# Patient Record
Sex: Female | Born: 1990 | Race: Black or African American | Hispanic: No | Smoking: Former smoker
Health system: Southern US, Community
[De-identification: ages and names within clinical notes are randomized; demographics above are authoritative.]

## PROBLEM LIST (undated history)

## (undated) DIAGNOSIS — G43909 Migraine, unspecified, not intractable, without status migrainosus: Secondary | ICD-10-CM

## (undated) HISTORY — PX: ANTERIOR CRUCIATE LIGAMENT REPAIR: SHX115

---

## 2012-08-31 ENCOUNTER — Emergency Department (HOSPITAL_BASED_OUTPATIENT_CLINIC_OR_DEPARTMENT_OTHER)
Admission: EM | Admit: 2012-08-31 | Discharge: 2012-08-31 | Disposition: A | Discharge status: Home / Self Care | Payer: BC Managed Care – PPO | Attending: Emergency Medicine | Admitting: Emergency Medicine

## 2012-08-31 ENCOUNTER — Encounter (HOSPITAL_BASED_OUTPATIENT_CLINIC_OR_DEPARTMENT_OTHER): Payer: Self-pay

## 2012-08-31 DIAGNOSIS — Z711 Person with feared health complaint in whom no diagnosis is made: Secondary | ICD-10-CM | POA: Insufficient documentation

## 2012-08-31 DIAGNOSIS — Z3202 Encounter for pregnancy test, result negative: Secondary | ICD-10-CM | POA: Insufficient documentation

## 2012-08-31 DIAGNOSIS — Z0000 Encounter for general adult medical examination without abnormal findings: Secondary | ICD-10-CM

## 2012-08-31 LAB — URINALYSIS, ROUTINE W REFLEX MICROSCOPIC
Bilirubin Urine: NEGATIVE
Hgb urine dipstick: NEGATIVE
Nitrite: NEGATIVE
Specific Gravity, Urine: 1.028 (ref 1.005–1.030)
Urobilinogen, UA: 1 mg/dL (ref 0.0–1.0)
pH: 5.5 (ref 5.0–8.0)

## 2012-08-31 LAB — PREGNANCY, URINE: Preg Test, Ur: NEGATIVE

## 2012-08-31 NOTE — ED Provider Notes (Signed)
History     CSN: [id number]  Arrival date & time 08/31/12  1201   First MD Initiated Contact with Patient 08/31/12 1236      Chief Complaint  Patient presents with  . Emesis    (Consider location/radiation/quality/duration/timing/severity/associated sxs/prior treatment) HPI Comments: Pt states she vomited once yesterday and was not feeling well but since all has resolved and felt better after the one episode of emesis.  States she attempted to return to work and they states she had to have a md note.  Patient is a 22 y.o. female presenting with vomiting. The history is provided by the patient.  Emesis Severity:  Mild Duration:  12 hours Timing:  Rare Number of daily episodes:  1 Quality:  Stomach contents Progression:  Resolved Chronicity:  New Recent urination:  Normal Relieved by:  None tried Ineffective treatments:  None tried Associated symptoms: no abdominal pain, no arthralgias, no chills, no cough, no diarrhea and no fever     History reviewed. No pertinent past medical history.  History reviewed. No pertinent past surgical history.  No family history on file.  History  Substance Use Topics  . Smoking status: Never Smoker   . Smokeless tobacco: Not on file  . Alcohol Use: Yes    OB History   Grav Para Term Preterm Abortions TAB SAB Ect Mult Living                  Review of Systems  Constitutional: Negative for chills.  Gastrointestinal: Positive for vomiting. Negative for abdominal pain and diarrhea.  Musculoskeletal: Negative for arthralgias.  All other systems reviewed and are negative.    Allergies  Review of patient's allergies indicates no known allergies.  Home Medications  No current outpatient prescriptions on file.  BP 117/71  Pulse 79  Temp(Src) 98.3 F (36.8 C) (Oral)  Resp 20  Ht 5\' 4"  (1.626 m)  Wt 135 lb (61.236 kg)  BMI 23.16 kg/m2  SpO2 100%  LMP 08/08/2012  Physical Exam  Nursing note and vitals  reviewed. Constitutional: She is oriented to person, place, and time. She appears well-developed and well-nourished. No distress.  HENT:  Head: Normocephalic and atraumatic.  Mouth/Throat: Oropharynx is clear and moist.  Eyes: Conjunctivae and EOM are normal. Pupils are equal, round, and reactive to light.  Neck: Normal range of motion. Neck supple.  Cardiovascular: Normal rate, regular rhythm and intact distal pulses.   No murmur heard. Pulmonary/Chest: Effort normal and breath sounds normal. No respiratory distress. She has no wheezes. She has no rales.  Abdominal: Soft. She exhibits no distension. There is no tenderness. There is no rebound and no guarding.  Musculoskeletal: Normal range of motion. She exhibits no edema and no tenderness.  Neurological: She is alert and oriented to person, place, and time.  Skin: Skin is warm and dry. No rash noted. No erythema.  Psychiatric: She has a normal mood and affect. Her behavior is normal.    ED Course  Procedures (including critical care time)  Labs Reviewed  URINALYSIS, ROUTINE W REFLEX MICROSCOPIC - Abnormal; Notable for the following:    APPearance CLOUDY (*)    Ketones, ur 15 (*)    All other components within normal limits  PREGNANCY, URINE   No results found.   1. General medical exam       MDM   Pt here for a MD note to return to work after episode of vomiting yesterday.  States feels fine now and has  a normal exam.  Will d/c home.       Gwyneth Sprout, MD 08/31/12 1501

## 2012-08-31 NOTE — ED Notes (Signed)
Pt reports she vomited x 1 this am-"i feel better now"-states she needs RTW note

## 2014-06-12 ENCOUNTER — Emergency Department (HOSPITAL_COMMUNITY)
Admission: EM | Admit: 2014-06-12 | Discharge: 2014-06-12 | Disposition: A | Discharge status: Home / Self Care | Payer: Self-pay | Source: Home / Self Care

## 2015-10-11 ENCOUNTER — Encounter (HOSPITAL_COMMUNITY): Payer: Self-pay | Admitting: Emergency Medicine

## 2015-10-11 ENCOUNTER — Emergency Department (HOSPITAL_COMMUNITY)
Admission: EM | Admit: 2015-10-11 | Discharge: 2015-10-11 | Disposition: A | Discharge status: Home / Self Care | Payer: Self-pay | Attending: Emergency Medicine | Admitting: Emergency Medicine

## 2015-10-11 DIAGNOSIS — F17210 Nicotine dependence, cigarettes, uncomplicated: Secondary | ICD-10-CM | POA: Insufficient documentation

## 2015-10-11 DIAGNOSIS — K047 Periapical abscess without sinus: Secondary | ICD-10-CM | POA: Insufficient documentation

## 2015-10-11 MED ORDER — CLINDAMYCIN HCL 150 MG PO CAPS: 450.0000 mg | ORAL_CAPSULE | Freq: Three times a day (TID) | ORAL | 0 refills | Status: DC

## 2015-10-11 MED ORDER — NAPROXEN 500 MG PO TABS: 500.0000 mg | ORAL_TABLET | Freq: Two times a day (BID) | ORAL | 0 refills | Status: DC

## 2015-10-11 NOTE — ED Triage Notes (Signed)
Patient states L sided facial swelling after she noticed a hard bump on her L upper gum area.   Patient denies any drainage.   Patient denies other symptoms.

## 2015-10-11 NOTE — ED Provider Notes (Signed)
[facility] Provider Note   CSN: [id number] Arrival date & time: 10/11/15  4098  First Provider Contact:  First MD Initiated Contact with Patient 10/11/15 1103    By signing my name below, I, Freida Busman, attest that this documentation has been prepared under the direction and in the presence of non-physician practitioner, Santiago Glad, PA-C. Electronically Signed: Freida Busman, Scribe. 10/11/2015. 11:04 AM.  History   Chief Complaint Chief Complaint  Patient presents with  . Facial Swelling    The history is provided by the patient. No language interpreter was used.    HPI Comments:  Kathleen Duffy is a 25 y.o. female who presents to the Emergency Department complaining of gradually worsening left sided  facial swelling which she woke up with 2 days ago. She reports associated mild pain to the site.  Pt also notes multiple known dental carries. She denies fever, SOB, throat swelling/difficulty swallowing, ear pain, itching to the site, recent insect bite, fever, nausea and vomiting. No alleviating factors noted.  History reviewed. No pertinent past medical history.  There are no active problems to display for this patient.   History reviewed. No pertinent surgical history.  OB History    No data available      Home Medications    Prior to Admission medications   Not on File    Family History No family history on file.  Social History Social History  Substance Use Topics  . Smoking status: Current Some Day Smoker    Types: Cigars  . Smokeless tobacco: Not on file  . Alcohol use Yes    Allergies   Review of patient's allergies indicates no known allergies.   Review of Systems Review of Systems  Constitutional: Negative for chills and fever.  HENT: Positive for facial swelling. Negative for ear pain and trouble swallowing.   Respiratory: Negative for shortness of breath.   Cardiovascular: Negative for chest pain.  Gastrointestinal: Negative for  nausea and vomiting.   Physical Exam Updated Vital Signs BP 128/83 (BP Location: Right Arm)   Pulse 79   Temp 98.3 F (36.8 C) (Oral)   Resp 18   SpO2 100%   Physical Exam  Constitutional: She is oriented to person, place, and time. She appears well-developed and well-nourished. No distress.  HENT:  Head: Normocephalic and atraumatic.  swelling noted to the left side of face no erythema or warmth.  TTP of left upper gingiva  No obvious dental abscess visualized  No sublingual tenderness or swelling No submandibular or submental lymphadenopathy   Eyes: Conjunctivae are normal.  Neck: Normal range of motion. Neck supple.  Cardiovascular: Normal rate, regular rhythm and normal heart sounds.   Pulmonary/Chest: Effort normal and breath sounds normal. No respiratory distress.  Neurological: She is alert and oriented to person, place, and time.  Skin: Skin is warm and dry.  Psychiatric: She has a normal mood and affect.  Nursing note and vitals reviewed.  ED Treatments / Results  DIAGNOSTIC STUDIES:  Oxygen Saturation is 100% on RA, normal by my interpretation.    COORDINATION OF CARE:  11:08 AM Discussed treatment plan with pt at bedside and pt agreed to plan.  Labs (all labs ordered are listed, but only abnormal results are displayed) Labs Reviewed - No data to display  EKG  EKG Interpretation None       Radiology No results found.  Procedures Procedures   Medications Ordered in ED Medications - No data to display   Initial Impression /  Assessment and Plan / ED Course  I have reviewed the triage vital signs and the nursing notes.  Pertinent labs & imaging results that were available during my care of the patient were reviewed by me and considered in my medical decision making (see chart for details).  Clinical Course      Final Clinical Impressions(s) / ED Diagnoses   Patient presents with left sided facial swelling and dentalgia.  No abscess  requiring immediate incision and drainage.  Exam not concerning for Ludwig's angina or pharyngeal abscess.  Will treat with Clindamycin and NSAIDs. Pt instructed to follow-up with dentist.  Discussed return precautions. Pt safe for discharge.  Final diagnoses:  None    New Prescriptions New Prescriptions   No medications on file   I personally performed the services described in this documentation, which was scribed in my presence. The recorded information has been reviewed and is accurate.     Santiago Glad, PA-C 10/11/15 1605    Pricilla Loveless, MD 10/12/15 [phone]

## 2016-08-18 ENCOUNTER — Emergency Department (HOSPITAL_COMMUNITY): Payer: Self-pay

## 2016-08-18 ENCOUNTER — Encounter (HOSPITAL_COMMUNITY): Payer: Self-pay | Admitting: Emergency Medicine

## 2016-08-18 ENCOUNTER — Emergency Department (HOSPITAL_COMMUNITY)
Admission: EM | Admit: 2016-08-18 | Discharge: 2016-08-18 | Disposition: A | Discharge status: Home / Self Care | Payer: Self-pay | Attending: Emergency Medicine | Admitting: Emergency Medicine

## 2016-08-18 DIAGNOSIS — Y9389 Activity, other specified: Secondary | ICD-10-CM | POA: Insufficient documentation

## 2016-08-18 DIAGNOSIS — F17290 Nicotine dependence, other tobacco product, uncomplicated: Secondary | ICD-10-CM | POA: Insufficient documentation

## 2016-08-18 DIAGNOSIS — S61411A Laceration without foreign body of right hand, initial encounter: Secondary | ICD-10-CM | POA: Insufficient documentation

## 2016-08-18 DIAGNOSIS — W25XXXA Contact with sharp glass, initial encounter: Secondary | ICD-10-CM | POA: Insufficient documentation

## 2016-08-18 DIAGNOSIS — Y999 Unspecified external cause status: Secondary | ICD-10-CM | POA: Insufficient documentation

## 2016-08-18 DIAGNOSIS — Y9289 Other specified places as the place of occurrence of the external cause: Secondary | ICD-10-CM | POA: Insufficient documentation

## 2016-08-18 NOTE — ED Notes (Signed)
Patient currently in xray ?

## 2016-08-18 NOTE — Discharge Instructions (Signed)
Keep area clean and dry. You can wash with soap and water °Change bandage at least once daily, more if it is dirty °Watch for signs of infection (redness, drainage) °Have stitches removed in 7 days ° °

## 2016-08-18 NOTE — ED Notes (Signed)
Declined W/C at D/C and was escorted to lobby by RN. 

## 2016-08-18 NOTE — ED Provider Notes (Signed)
[facility] Provider Note   CSN: [id number] Arrival date & time: 08/18/16  1114  By signing my name below, I, [name], attest that this documentation has been prepared under the direction and in the presence of [name], PA-C. Electronically Signed: Thelma BargeNick Cochran, Scribe. 08/18/16. 12:40 PM.  History   Chief Complaint Chief Complaint  Patient presents with  . Hand Injury   The history is provided by the patient. No language interpreter was used.   HPI Comments: [name] is a 26 y.o. female who presents to the Emergency Department complaining of constant, gradually worsening right-sided hand pain after punching a glass door that occurred last night. She has associated abrasions and lacerations with swelling to the area. She states she got mad and punched the glass door and her hand went through the glass and cut her. She used cold water and peroxide to clean the area. She denies other associated symptoms. Pt is right-hand dominant.  History reviewed. No pertinent past medical history.  There are no active problems to display for this patient.   History reviewed. No pertinent surgical history.  OB History    No data available       Home Medications    Prior to Admission medications   Medication Sig Start Date End Date Taking? Authorizing Provider  clindamycin (CLEOCIN) 150 MG capsule Take 3 capsules (450 mg total) by mouth 3 (three) times daily. 10/11/15   Santiago GladLaisure, Heather, PA-C  naproxen (NAPROSYN) 500 MG tablet Take 1 tablet (500 mg total) by mouth 2 (two) times daily. 10/11/15   Santiago GladLaisure, Heather, PA-C    Family History No family history on file.  Social History Social History  Substance Use Topics  . Smoking status: Current Some Day Smoker    Types: Cigars  . Smokeless tobacco: Not on file  . Alcohol use Yes     Allergies   Patient has no known allergies.   Review of Systems Review of Systems  Musculoskeletal: Positive for joint swelling.    Skin: Positive for wound.  All other systems reviewed and are negative.    Physical Exam Updated Vital Signs BP 125/88 (BP Location: Left Arm)   Pulse 95   Temp 98.5 F (36.9 C) (Oral)   Resp 20   SpO2 100%   Physical Exam  Constitutional: She is oriented to person, place, and time. She appears well-developed and well-nourished. No distress.  HENT:  Head: Normocephalic and atraumatic.  Eyes: Conjunctivae are normal. Pupils are equal, round, and reactive to light. Right eye exhibits no discharge. Left eye exhibits no discharge. No scleral icterus.  Neck: Normal range of motion.  Cardiovascular: Normal rate.   Pulmonary/Chest: Effort normal. No respiratory distress.  Abdominal: She exhibits no distension.  Musculoskeletal:  V shaped laceration over the dorsal aspect of hand Several skin tears  Neurological: She is alert and oriented to person, place, and time.  Skin: Skin is warm and dry.  Psychiatric: She has a normal mood and affect. Her behavior is normal.  Nursing note and vitals reviewed.    ED Treatments / Results  DIAGNOSTIC STUDIES: Oxygen Saturation is 100% on RA, normal by my interpretation.    COORDINATION OF CARE: 12:38 PM Discussed treatment plan with pt at bedside and pt agreed to plan. Labs (all labs ordered are listed, but only abnormal results are displayed) Labs Reviewed - No data to display  EKG  EKG Interpretation None       Radiology No results found.  Procedures Procedures (including critical care time) LACERATION REPAIR Performed by: Bethel Born Authorized by: Bethel Born Consent: Verbal consent obtained. Risks and benefits: risks, benefits and alternatives were discussed Consent given by: patient Patient identity confirmed: provided demographic data Prepped and Draped in normal sterile fashion Wound explored  Laceration Location: right hand  Laceration Length: 2 cm  No Foreign Bodies seen or  palpated  Anesthesia: local infiltration  Local anesthetic: lidocaine 2% with epinephrine  Anesthetic total: 2 ml  Irrigation method: syringe Amount of cleaning: standard  Skin closure: 5-0 Prolene  Number of sutures: 3  Technique: Simple interrupted  Patient tolerance: Patient tolerated the procedure well with no immediate complications.   Medications Ordered in ED Medications - No data to display   Initial Impression / Assessment and Plan / ED Course  I have reviewed the triage vital signs and the nursing notes.  Pertinent labs & imaging results that were available during my care of the patient were reviewed by me and considered in my medical decision making (see chart for details).  26 year old female who presents with hand laceration. It was repaired and irrigated in the ED. Bottom of the wound visualized and bleeding controlled. 3 sutures placed. Xray was negative. Wound care discussed and advised to return to have stitches removed in 7 days. Return precautions discussed.   Final Clinical Impressions(s) / ED Diagnoses   Final diagnoses:  Laceration of right hand without foreign body, initial encounter    New Prescriptions New Prescriptions   No medications on file   I personally performed the services described in this documentation, which was scribed in my presence. The recorded information has been reviewed and is accurate.     Bethel Born, PA-C 08/21/16 1557    Margarita Grizzle, MD 08/24/16 [phone]

## 2016-08-18 NOTE — ED Triage Notes (Signed)
Pt reports punching through a glass window with her right hand last night, multiple abrasions/small lacerations as well as swelling present. Pt is able to move fingers. Pulse intact. tetanus shot in January

## 2016-08-18 NOTE — ED Notes (Signed)
Patient returned from xray.

## 2017-07-21 ENCOUNTER — Ambulatory Visit: Payer: Self-pay | Admitting: Emergency Medicine

## 2019-01-07 ENCOUNTER — Ambulatory Visit (INDEPENDENT_AMBULATORY_CARE_PROVIDER_SITE_OTHER): Payer: PRIVATE HEALTH INSURANCE

## 2019-01-07 ENCOUNTER — Encounter (HOSPITAL_COMMUNITY): Payer: Self-pay

## 2019-01-07 ENCOUNTER — Ambulatory Visit (HOSPITAL_COMMUNITY)
Admission: EM | Admit: 2019-01-07 | Discharge: 2019-01-07 | Disposition: A | Discharge status: Home / Self Care | Payer: PRIVATE HEALTH INSURANCE | Attending: Emergency Medicine | Admitting: Emergency Medicine

## 2019-01-07 ENCOUNTER — Other Ambulatory Visit: Payer: Self-pay

## 2019-01-07 DIAGNOSIS — S8391XA Sprain of unspecified site of right knee, initial encounter: Secondary | ICD-10-CM | POA: Diagnosis not present

## 2019-01-07 MED ORDER — NAPROXEN 500 MG PO TABS: 500.0000 mg | ORAL_TABLET | Freq: Two times a day (BID) | ORAL | 0 refills | Status: DC

## 2019-01-07 NOTE — Discharge Instructions (Signed)
Xray looks well today.  This is consistent with a sprain.  I do recommend follow up with sports medicine for further evaluation and management long term.  Ice, elevation, naproxen twice a day for pain, take with food.  Activity as tolerated.

## 2019-01-07 NOTE — ED Provider Notes (Signed)
[facility]    CSN: [id number] Arrival date & time: 01/07/19  1025      History   Chief Complaint Chief Complaint  Patient presents with   Knee Pain    Right    HPI Kathleen Duffy is a 28 y.o. female.   Kathleen Duffy presents with complaints of right knee pain after fall last night. She tripped over a raised concrete slab, her foot hit it and planted as she fell, therefore with some twist to the knee. She landed on her buttocks. Had immediate pain but was able to get up and ambulate pain much worse this morning, it woke her from sleep. Hasn't taken any medications for pain. Denies any previous injury to the knee. No numbness or tingling to the foot. She has been able to walk on it but it is painful. No ankle pain. No redness or warmth. Minimal swelling.    ROS per HPI, negative if not otherwise mentioned.      History reviewed. No pertinent past medical history.  There are no active problems to display for this patient.   History reviewed. No pertinent surgical history.  OB History   No obstetric history on file.      Home Medications    Prior to Admission medications   Medication Sig Start Date End Date Taking? Authorizing Provider  naproxen (NAPROSYN) 500 MG tablet Take 1 tablet (500 mg total) by mouth 2 (two) times daily with a meal. 01/07/19   Georgetta Haber, NP    Family History Family History  Family history unknown: Yes    Social History Social History   Tobacco Use   Smoking status: Current Some Day Smoker    Types: Cigars  Substance Use Topics   Alcohol use: Yes   Drug use: No     Allergies   Patient has no known allergies.   Review of Systems Review of Systems   Physical Exam Triage Vital Signs ED Triage Vitals  Enc Vitals Group     BP 01/07/19 1055 (!) 125/96     Pulse Rate 01/07/19 1055 99     Resp 01/07/19 1055 18     Temp 01/07/19 1055 98.7 F (37.1 C)     Temp Source 01/07/19 1055 Oral     SpO2  01/07/19 1055 99 %     Weight --      Height --      Head Circumference --      Peak Flow --      Pain Score 01/07/19 1058 8     Pain Loc --      Pain Edu? --      Excl. in GC? --    No data found.  Updated Vital Signs BP (!) 125/96 (BP Location: Left Arm)    Pulse 99    Temp 98.7 F (37.1 C) (Oral)    Resp 18    LMP 12/23/2018 (Exact Date)    SpO2 99%    Physical Exam Constitutional:      General: She is not in acute distress.    Appearance: She is well-developed.  Cardiovascular:     Rate and Rhythm: Normal rate.  Pulmonary:     Effort: Pulmonary effort is normal.  Musculoskeletal:     Right knee: She exhibits decreased range of motion and bony tenderness. She exhibits no swelling, no effusion, no ecchymosis, no deformity, no laceration, no erythema, normal alignment, no LCL laxity, normal patellar mobility, normal meniscus and  no MCL laxity. Tenderness found. Medial joint line, lateral joint line and patellar tendon tenderness noted.     Comments: Pretty generalized tenderness to right knee on palpation and with rom; tenderness at proximal tibia as well as to patellar tendon, patella, and to surrounding lateral and medial soft tissues; pain with flexion and extension, as well as pain with medial and lateral stress; no obvious laxity noted; foot sensation intact and without any ankle tenderness   Skin:    General: Skin is warm and dry.  Neurological:     Mental Status: She is alert and oriented to person, place, and time.      UC Treatments / Results  Labs (all labs ordered are listed, but only abnormal results are displayed) Labs Reviewed - No data to display  EKG   Radiology Dg Knee Complete 4 Views Right  Result Date: 01/07/2019 CLINICAL DATA:  Per pt: fell last night, tripped over a piece of concrete, right leg twisted, fell onto the butt. Pain is the right knee, medially and laterally to the patella. No prior injury to the right knee. Patient arrived by  wheelchair. Patient is not a diabeticknee injury yesterday, pain at proximal tibia on palpation EXAM: RIGHT KNEE - COMPLETE 4+ VIEW COMPARISON:  None. FINDINGS: No fracture of the proximal tibia or distal femur. Patella is normal. No joint effusion. IMPRESSION: No fracture or dislocation. Electronically Signed   By: Suzy Bouchard M.D.   On: 01/07/2019 11:41    Procedures Procedures (including critical care time)  Medications Ordered in UC Medications - No data to display  Initial Impression / Assessment and Plan / UC Course  I have reviewed the triage vital signs and the nursing notes.  Pertinent labs & imaging results that were available during my care of the patient were reviewed by me and considered in my medical decision making (see chart for details).     Xray without acute findings today. Consistent with sprain. Ice, elevation, NSAIDS, brace or ace wrap, activity as tolerated. Crutches and knee sleeve provided. Encouraged follow up with sports medicine and/or orthopedics. Return precautions provided. Patient verbalized understanding and agreeable to plan.   Final Clinical Impressions(s) / UC Diagnoses   Final diagnoses:  Sprain of right knee, unspecified ligament, initial encounter     Discharge Instructions     Xray looks well today.  This is consistent with a sprain.  I do recommend follow up with sports medicine for further evaluation and management long term.  Ice, elevation, naproxen twice a day for pain, take with food.  Activity as tolerated.     ED Prescriptions    Medication Sig Dispense Auth. Provider   naproxen (NAPROSYN) 500 MG tablet Take 1 tablet (500 mg total) by mouth 2 (two) times daily with a meal. 30 tablet Zigmund Gottron, NP     PDMP not reviewed this encounter.   Zigmund Gottron, NP 01/07/19 1249

## 2019-01-07 NOTE — ED Triage Notes (Signed)
Pt presents with right knee pain after a fall last night after she tripped over a concrete slab.

## 2019-01-12 ENCOUNTER — Ambulatory Visit (INDEPENDENT_AMBULATORY_CARE_PROVIDER_SITE_OTHER): Payer: PRIVATE HEALTH INSURANCE | Admitting: Sports Medicine

## 2019-01-12 ENCOUNTER — Other Ambulatory Visit: Payer: Self-pay

## 2019-01-12 ENCOUNTER — Encounter: Payer: Self-pay | Admitting: Sports Medicine

## 2019-01-12 VITALS — BP 120/92 | Ht 64.0 in | Wt 200.0 lb

## 2019-01-12 DIAGNOSIS — M25561 Pain in right knee: Secondary | ICD-10-CM

## 2019-01-12 NOTE — Progress Notes (Addendum)
[facility] [address] [place], [place] [address] Phone: [phone] Fax: [phone]   Patient Name: Kathleen Duffy Date of Birth: 26-Mar-1990 Medical Record Number: [id number] Gender: female Date of Encounter: 01/12/2019  SUBJECTIVE:      Chief Complaint:  Right knee pain   HPI:  Kathleen Duffy is a 28 year old female presenting with 6 days of right knee pain.  Last Thursday night she was walking backwards and her right foot got caught on some concrete sticking up from the ground and her right knee twisted causing her to fall.  Initially was very painful, she cannot recall if she heard a pop.  Next day there was pain, swelling, and inability to bear weight.  She went to an urgent care and had normal x-rays and was given a knee brace and crutches.  She has been taking Naprosyn that has helped some of the pain.  Aggravating factors include bending the knee and full weight-bear.  Deviating factors include rest.  She is unable to return to work until she is cleared by a physician.  She has never injured this knee before.  She denies any numbness, tingling, erythema, or skin changes.  She complains of instability, even Sunday tried to walk without crutches and fell down causing more pain.     ROS:     See HPI.   PERTINENT  PMH / PSH / FH / SH:  Past Medical, Surgical, Social, and Family History Reviewed & Updated in the EMR. Pertinent findings include:  Intermittent cigar use, works in a [employer]:  BP (!) 120/92   Ht 5\' 4"  (1.626 m)   Wt 200 lb (90.7 kg)   LMP 12/23/2018 (Exact Date)   BMI 34.33 kg/m  Physical Exam:  Vital signs are reviewed.   GEN: Alert and oriented, NAD Pulm: Breathing unlabored PSY: normal mood, congruent affect  MSK: Right knee: Moderate effusion most prominent at superior lateral knee Mild TTP along medial joint line and lateral femoral condyle Locking TKE and pain beyond 10 degrees of knee flexion Valgus stress  test demonstrates increased laxity with endpoint compared to left side Varus stress test demonstrates increased laxity with endpoint compared to left side Positive Lachman's Positive levers Negative posterior drawer Unable to fully assess meniscus 2/2 pain Patellar and quadriceps tendons unremarkable. Hamstring and quadriceps strength is normal.  Neurovascularly intact.  Left knee: Normal to inspection with no erythema or effusion or obvious bony abnormalities. Palpation normal with no warmth, joint line tenderness, patellar tenderness, or condyle tenderness. ROM full in flexion and extension and lower leg rotation. Ligaments with solid consistent endpoints including ACL, PCL, LCL, MCL. Negative Mcmurray's and Thessaly tests. Non painful patellar compression. Patellar glide without crepitus. Patellar and quadriceps tendons unremarkable. Hamstring and quadriceps strength is normal.  Neurovascularly intact.   ASSESSMENT & PLAN:   1. Right knee pain and swelling  Given mechanism of injury and physical exam findings in the setting of a normal XR, there is concern for ACL tear and possible female triad injury.  We fitted patient for a hinged knee brace today and she can continue to use her crutches.  We have ordered an MRI to evaluate for intra-articular soft tissue injury.  She was also provided a note for work to do light duty and sitdown work.  I will call her with the results of the MRI and refer her to orthopedic surgery if indicated.  She can continue to work on light quad strengthening exercises.  Continue naproxen.  Judge Stall, DO, ATC Sports Medicine Fellow  Addendum:  Patient seen in the office by fellow.  His history, exam, plan of care were precepted with me.  Norton Blizzard MD Marrianne Mood

## 2019-01-17 ENCOUNTER — Other Ambulatory Visit: Payer: Self-pay

## 2019-01-17 NOTE — Progress Notes (Signed)
Called pt's insurance company-[employer] through [place]. [employer] for upcoming MRI. Reference for the call is Pam B. 01/17/2019. She will have a $200 co-pay due at the time of her visit.

## 2019-01-21 NOTE — Progress Notes (Signed)
No prior auth required for upcoming MRI per Pam from [place] on 01/17/2019. Pt made aware of $200 co-pay due to [place] when she goes for her MRI.

## 2019-02-01 ENCOUNTER — Ambulatory Visit
Admission: RE | Admit: 2019-02-01 | Discharge: 2019-02-01 | Disposition: A | Discharge status: Home / Self Care | Payer: PRIVATE HEALTH INSURANCE | Source: Ambulatory Visit | Attending: Sports Medicine | Admitting: Sports Medicine

## 2019-02-01 ENCOUNTER — Other Ambulatory Visit: Payer: Self-pay

## 2019-02-01 DIAGNOSIS — M25561 Pain in right knee: Secondary | ICD-10-CM

## 2019-02-02 ENCOUNTER — Ambulatory Visit (INDEPENDENT_AMBULATORY_CARE_PROVIDER_SITE_OTHER): Payer: PRIVATE HEALTH INSURANCE | Admitting: Sports Medicine

## 2019-02-02 ENCOUNTER — Encounter: Payer: Self-pay | Admitting: Sports Medicine

## 2019-02-02 VITALS — BP 118/86 | Ht 64.0 in | Wt 200.0 lb

## 2019-02-02 DIAGNOSIS — M25561 Pain in right knee: Secondary | ICD-10-CM

## 2019-02-02 MED ORDER — NAPROXEN 500 MG PO TABS: 500.0000 mg | ORAL_TABLET | Freq: Two times a day (BID) | ORAL | 0 refills | Status: DC

## 2019-02-02 NOTE — Addendum Note (Signed)
Addended by: Karlton Lemon R on: 02/02/2019 11:40 AM   Modules accepted: Level of Service

## 2019-02-02 NOTE — Progress Notes (Addendum)
[facility] [address] [place],  [address] Phone: [phone] Fax: [phone]   Patient Name: Kathleen Duffy Date of Birth: 10-Oct-1990 Medical Record Number: [id number] Gender: female Date of Encounter: 02/02/2019  CC: Right knee pain  HPI: Kathleen Duffy is following up regarding her right knee.  She had an MRI yesterday that we reviewed today.  She is utilizing a cane and wearing a knee brace.  She is still unable to fully extend her knee and flex beyond 30 degrees.  She describes 1 episode of instability when she was lifting up a 50 pound child.  She still feels a little bit of swelling in the upper knee.  She is using naproxen that helps.  She is icing regularly.  She denies any numbness, tingling, erythema, or skin changes.  No past medical history on file.  No current outpatient medications on file prior to visit.   No current facility-administered medications on file prior to visit.     No past surgical history on file.  No Known Allergies  Social History   Socioeconomic History  . Marital status: Single    Spouse name: Not on file  . Number of children: Not on file  . Years of education: Not on file  . Highest education level: Not on file  Occupational History  . Not on file  Social Needs  . Financial resource strain: Not on file  . Food insecurity    Worry: Not on file    Inability: Not on file  . Transportation needs    Medical: Not on file    Non-medical: Not on file  Tobacco Use  . Smoking status: Current Some Day Smoker    Types: Cigars  Substance and Sexual Activity  . Alcohol use: Yes  . Drug use: No  . Sexual activity: Not on file  Lifestyle  . Physical activity    Days per week: Not on file    Minutes per session: Not on file  . Stress: Not on file  Relationships  . Social Herbalist on phone: Not on file    Gets together: Not on file    Attends religious service: Not on file    Active  member of club or organization: Not on file    Attends meetings of clubs or organizations: Not on file    Relationship status: Not on file  . Intimate partner violence    Fear of current or ex partner: Not on file    Emotionally abused: Not on file    Physically abused: Not on file    Forced sexual activity: Not on file  Other Topics Concern  . Not on file  Social History Narrative  . Not on file    Family History  Family history unknown: Yes    BP 118/86   Ht 5\' 4"  (1.626 m)   Wt 200 lb (90.7 kg)   BMI 34.33 kg/m   ROS:  See HPI CONST: no F/C, no malaise, no fatigue MSK: See above NEURO: no numbness/tingling SKIN: no rash, no lesions HEME: no bleeding, no bruising, no erythema  Objective: GEN: Alert and oriented, NAD Pulm: Breathing unlabored PSY: normal mood, congruent affect  Right knee: Moderate effusion most prominent at superior lateral knee Mild TTP along medial joint line and lateral femoral condyle Lacking TKE and pain beyond 25 degrees of knee flexion Valgus stress test demonstrates increased laxity with endpoint compared to left side Varus stress test demonstrates  increased laxity with endpoint compared to left side Positive Lachman's Positive levers Negative posterior drawer Unable to fully assess meniscus 2/2 pain Patellar and quadriceps tendons unremarkable. Hamstring and quadriceps strength is normal.  Neurovascularly intact.  MRI examination of the R knee 02/01/19  IMPRESSION: 1. Nondisplaced tear of the lateral meniscus.  No paralabral cyst. 2. Partial tear anteromedial bundle of the ACL with edema in the intracondylar notch. 3. Mild intrasubstance sprain of the MCL and fibular collateral ligament. 4. Osseous contusions as described above.  No osseous fracture. 5. Moderate knee joint effusion 6. Insertional quadriceps tendinosis   Assessment and Plan:  1.  Right knee pain  Given clinical instability in the setting of partial ACL tear  with lateral meniscus tear and collateral ligament sprain, will refer patient to orthopedic office for surgical consultation.  In the interim I have refilled her naproxen, recommended continuing to wear a brace and utilizing cane.  She will follow-up with [place] if they determine no surgical intervention is required.   Judge Stall, DO, ATC Sports Medicine Fellow  Addendum:  Patient seen in the office by fellow.  His history, exam, plan of care were precepted with me.  Norton Blizzard MD Marrianne Mood

## 2019-02-02 NOTE — Patient Instructions (Addendum)
We have scheduled you to see Dr. Rhona Raider for your right knee [place] [phone]  Appt: 02/09/2019 @ 8 am.  They will mail you new patient paperwork to fill and bring with you to your appt. Please wear a mask and attend your appt alone.

## 2019-04-14 ENCOUNTER — Other Ambulatory Visit: Payer: Self-pay

## 2019-04-14 ENCOUNTER — Ambulatory Visit: Payer: Managed Care, Other (non HMO) | Attending: Orthopaedic Surgery | Admitting: Physical Therapy

## 2019-04-14 ENCOUNTER — Encounter: Payer: Self-pay | Admitting: Physical Therapy

## 2019-04-14 DIAGNOSIS — R262 Difficulty in walking, not elsewhere classified: Secondary | ICD-10-CM | POA: Diagnosis present

## 2019-04-14 DIAGNOSIS — M25561 Pain in right knee: Secondary | ICD-10-CM | POA: Diagnosis not present

## 2019-04-14 DIAGNOSIS — R600 Localized edema: Secondary | ICD-10-CM | POA: Diagnosis present

## 2019-04-14 DIAGNOSIS — M6281 Muscle weakness (generalized): Secondary | ICD-10-CM | POA: Insufficient documentation

## 2019-04-14 NOTE — Therapy (Signed)
[place], [place], [address] Phone: [phone]   Fax:  [phone]  Physical Therapy Evaluation  Patient Details  Name: Kathleen Duffy MRN: [id number] Date of Birth: [birth year] Referring Provider (PT): [place], [place] DO   (ACL surgery Dr Christena Flake  03-31-19)   Encounter Date: 04/14/2019  PT End of Session - 04/14/19 0945    Visit Number  1    Number of Visits  17    Date for PT Re-Evaluation  06/09/19    Authorization Type  Self pay    PT Start Time  0850    PT Stop Time  0930    PT Time Calculation (min)  40 min    Activity Tolerance  Patient tolerated treatment well    Behavior During Therapy  [facility] for tasks assessed/performed       History reviewed. No pertinent past medical history.  History reviewed. No pertinent surgical history.  There were no vitals filed for this visit.   Subjective Assessment - 04/14/19 0926    Subjective  I tripped over a stump in friends back yard on 01-06-2019 and I had ACL surgery on with Dr Christena Flake   03-31-2019.  I am about 2 weeks out now and can hardly bend my knee    Pertinent History  nothing remarkable    Limitations  Standing;Walking;House hold activities    How long can you sit comfortably?  unlimited    How long can you stand comfortably?  5-10 min    How long can you walk comfortably?  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  Yes    Pain Score  5    at worst  10/10   Pain Location  Knee    Pain Type  Acute pain    Pain Onset  1 to 4 weeks ago   2 weeks   Pain Frequency  Constant    Aggravating Factors   moving it,         [facility] PT Assessment - 04/14/19 0001      Assessment   Medical Diagnosis  LT ACL repair , lateral meniscus tear     Referring Provider (PT)  Kathrynn Speed, Dominic DO     ACL surgery Dr Christena Flake  03-31-19   Onset Date/Surgical Date   03/31/19   fell over stump in ground injuring knee January 06, 2020   Hand Dominance  Right    Next MD Visit  feb 8 with Dr Latanya Maudlin    Prior Therapy  none      Precautions   Precautions  Knee    Precaution Comments  MD said to stop wearing immobilizer brace      Restrictions   Weight Bearing Restrictions  Yes    RLE Weight Bearing  Weight bearing as tolerated   RT     Balance Screen   Has the patient fallen in the past 6 months  Yes    How many times?  1   01-06-2019   Has the patient had a decrease in activity level because of a fear of falling?   No    Is the patient reluctant to leave their home because of a fear of falling?   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   Vocation Requirements  carry no more than 50 lb      Cognition   Overall Cognitive Status  Within Functional Limits for tasks assessed      Observation/Other Assessments   Focus on Therapeutic Outcomes (FOTO)   FOTO intake 32% limitaaion 68%  predicted 38%      Observation/Other Assessments-Edema    Edema  Circumferential      Circumferential Edema   Circumferential - Right  48.5 cm    Circumferential - Left   42.0      Sensation   Light Touch  Appears Intact      ROM / Strength   AROM / PROM / Strength  AROM;PROM;Strength      AROM   Overall AROM   Deficits    Right Knee Extension  20   +20   Right Knee Flexion  36    Left Knee Extension  0    Left Knee Flexion  135      PROM   Overall PROM   Deficits    Right Knee Extension  11   +80from horizontal   Right Knee Flexion  55    Left Knee Extension  -5    Left Knee Flexion  139      Strength   Overall Strength  Deficits    Right Knee Flexion  3-/5    Right Knee Extension  3-/5    Left Knee  Flexion  4+/5    Left Knee Extension  4+/5      Palpation   Palpation comment  tenderness over lateral joint line, edema   RT knee     Ambulation/Gait   Ambulation/Gait  Yes    Assistive device  Straight cane    Gait Pattern  Antalgic    Ambulation Surface  Level    Pre-Gait Activities  wt shifting side side at counter                Objective measurements completed on examination: See above findings.      [facility] Adult PT Treatment/Exercise - 04/14/19 0001      Knee/Hip Exercises: Standing   Other Standing Knee Exercises  standing partial squat at counter 2 x 10       Knee/Hip Exercises: Supine   Quad Sets  10 reps    Quad Sets Limitations   x 3 with towel roll under ankle     Heel Slides  Right;10 reps;AAROM    Heel Slides Limitations  with strap    Other Supine Knee/Hip Exercises  with knee flexion AAROM on wall and LT leg assist 10 reps               PT Short Term Goals - 04/14/19 1319      PT SHORT TERM GOAL #1   Title  Pt will be independent with initial HEP    Baseline  no knowledge of ex    Time  4    Period  Weeks    Status  New    Target Date  05/12/19      PT SHORT TERM GOAL #2   Title  AROM of knee extension -10 to 100 flexion to  increase mobility for transitional movements    Baseline  eval RT knee flexion 36  PROM 55    Time  4    Period  Weeks    Status  New    Target Date  05/12/19      PT SHORT TERM GOAL #3   Title  Demonstrate and verbalize understanding of condition management including RICE, positioning, use of A.D., HEP.    Time  4    Period  Weeks    Status  New    Target Date  05/12/19        PT Long Term Goals - 04/14/19 1303      PT LONG TERM GOAL #1   Title  Pt will be independent with advanced HEP.    Baseline  no knowledge    Time  8    Period  Weeks    Status  New    Target Date  06/09/19      PT LONG TERM GOAL #2   Title  Pt will improve R knee extensor/flexor strength to >/= 4+/5 without excerbating   pain greater than 2/10  to promote safety with walking/standing activities    Baseline  unable to walk without cane and antalgic gait    Time  8    Period  Weeks    Status  New    Target Date  06/09/19      PT LONG TERM GOAL #3   Title  PT with be able to walk/stand >/= 1 hour with no AD with </= 2/10 pain for functional endurance and return to leisure activities post DC including walking dog    Baseline  Pt cannot stand longer than 5-10 minutes and must use a cane    Time  8    Period  Weeks    Status  New    Target Date  06/09/19      PT LONG TERM GOAL #4   Title  FOTO will improve from  68% limitation  to  38% limitation     indicating improved functional mobility .    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    Baseline  unable to walk without AD or carry only minimal weight    Time  8    Period  Weeks    Status  New    Target Date  06/09/19          Access Code: [email or web address]  URL: [email or web address]  Date: 04/14/2019  Prepared by: Garen Lah   Exercises  Supine Heel Slide with Strap -  10 reps - 3 sets - 2-3x daily - 7x weekly  Supine Knee Flexion AAROM at Wall - 10 reps - 3 sets - 2-3x daily - 7x weekly  Standing Partial Squat - 10 reps - 3 sets - 2-3x daily - 7x weekly  Supine Knee Extension Stretch on Towel Roll - 20 reps - 3 sets - 3x daily - 7x weekly      Plan - 04/14/19  1327    Clinical Impression Statement  Pt is 29 yo female s/p   RT ACL repair,Mild sprain of the MCL, non displaced tear of lateral meniscus. on 03-31-19 by Dr Elsie Lincoln. 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  Pt presents with impairments including pain, knee weakness, impaired ROM, difficulty with walking, stairs, . Pt would benefit from skilled PT for 3 times a week  for 1st week. and then 2 x a week for remaining 7 weeks of 8 weeks. and return to pain-free PLOF and be able to return to walking dog in the neighborhood.    Stability/Clinical Decision Making  Stable/Uncomplicated    Clinical Decision Making  Low    Rehab Potential  Good    PT Frequency  2x / week   1st week 3 x a week then 2 x a week for last 7 weeks   PT Duration  8 weeks    PT Treatment/Interventions  ADLs/Self Care Home Management;Electrical Stimulation;Cryotherapy;Moist Heat;Ultrasound;Gait training;Stair training;Functional mobility training;Therapeutic activities;Therapeutic exercise;Neuromuscular re-education;Patient/family education;Scar mobilization;Passive range of motion;Manual techniques;Dry needling;Taping;Vasopneumatic Device    PT Next Visit Plan  ACL week 3 ( surgery 03-31-19) continue strengthening.  gait training review HEP and add to HEP for RT knee AAROM and strength    PT Home Exercise Plan  quad set.  knee flexion on wall, heel slides with strap mini squats at counter,  wt shifting       Patient will benefit from skilled therapeutic intervention in order to improve the following deficits and impairments:  Abnormal gait, Decreased activity tolerance, Decreased mobility, Decreased range of motion, Decreased strength, Increased edema, Difficulty walking, Pain  Visit Diagnosis: Acute pain of right knee  Difficulty in walking, not elsewhere classified  Muscle weakness (generalized)  Localized edema     Problem List There are no problems to display for this  patient.  Garen Lah, PT Certified Exercise Expert for the Aging Adult  04/14/19 1:37 PM Phone: [phone] Fax: [phone]  [facility] [address] [place], [place], [address] Phone: [phone]   Fax:  [phone]  Name: Kathleen Duffy MRN: [id number] Date of Birth: 12-13-1990

## 2019-04-14 NOTE — Patient Instructions (Addendum)
  Access Code: [email or web address]  URL: [email or web address]  Date: 04/14/2019  Prepared by: Garen Lah   Exercises  Supine Heel Slide with Strap - 10 reps - 3 sets - 2-3x daily - 7x weekly  Supine Knee Flexion AAROM at Wall - 10 reps - 3 sets - 2-3x daily - 7x weekly  Standing Partial Squat - 10 reps - 3 sets - 2-3x daily - 7x weekly  Supine Knee Extension Stretch on Towel Roll - 20 reps - 3 sets - 3x daily - 7x weekly          Garen Lah, PT Certified Exercise Expert for the Aging Adult  04/14/19 9:31 AM Phone: [phone] Fax: [phone]

## 2019-04-18 ENCOUNTER — Encounter: Payer: Self-pay | Admitting: Physical Therapy

## 2019-04-18 ENCOUNTER — Other Ambulatory Visit: Payer: Self-pay

## 2019-04-18 ENCOUNTER — Ambulatory Visit: Payer: Self-pay | Attending: Orthopaedic Surgery | Admitting: Physical Therapy

## 2019-04-18 DIAGNOSIS — R600 Localized edema: Secondary | ICD-10-CM | POA: Insufficient documentation

## 2019-04-18 DIAGNOSIS — M25561 Pain in right knee: Secondary | ICD-10-CM | POA: Insufficient documentation

## 2019-04-18 DIAGNOSIS — R262 Difficulty in walking, not elsewhere classified: Secondary | ICD-10-CM | POA: Insufficient documentation

## 2019-04-18 DIAGNOSIS — M6281 Muscle weakness (generalized): Secondary | ICD-10-CM | POA: Insufficient documentation

## 2019-04-18 NOTE — Therapy (Signed)
[facility] [address] [place], [place], [address] Phone: [phone]   Fax:  [phone]  Physical Therapy Treatment  Patient Details  Name: Kathleen Duffy MRN: [id number] Date of Birth: 09-22-1990 Referring Provider (PT): [place], [place] DO   (ACL surgery Dr Elsie Lincoln  03-31-19)   Encounter Date: 04/18/2019  PT End of Session - 04/18/19 0944    Visit Number  2    Number of Visits  17    Date for PT Re-Evaluation  06/09/19    Authorization Type  [employer]    PT Start Time  0928    PT Stop Time  1028    PT Time Calculation (min)  60 min    Activity Tolerance  Patient tolerated treatment well    Behavior During Therapy  [facility] for tasks assessed/performed       History reviewed. No pertinent past medical history.  History reviewed. No pertinent surgical history.  There were no vitals filed for this visit.  Subjective Assessment - 04/18/19 0929    Subjective  Pt. reports knee is very stiff but no pain this AM. She has been working on HEP/bending knee since eval.    Currently in Pain?  No/denies         [facility] PT Assessment - 04/18/19 0001      AROM   Right Knee Extension  -5    Right Knee Flexion  60                   [facility] Adult PT Treatment/Exercise - 04/18/19 0001      Knee/Hip Exercises: Standing   Heel Raises  Both;20 reps    Heel Raises Limitations  Airex    Hip Flexion Limitations  standing marches on Airex 2x10, standing front SLR 2x10    Terminal Knee Extension  AROM;Strengthening;Right;2 sets;10 reps    Theraband Level (Terminal Knee Extension)  Level 3 (Green)    Hip Abduction  AROM;Stengthening;Right;2 sets;10 reps;Knee straight    Hip Extension  AROM;Stengthening;Right;2 sets;10 reps;Knee straight    Other Standing Knee Exercises  mini squat at counter 2x10    Other Standing Knee Exercises  standing weighshifts lateral and fw x 20 ea.      Knee/Hip Exercises: Supine   Quad Sets   AROM;Strengthening;Right;20 reps    Quad Sets Limitations  5 sec holds, towel under knee    Straight Leg Raises Limitations  attempted but unable due to weakness/quad lag      Modalities   Modalities  Vasopneumatic      Vasopneumatic   Number Minutes Vasopneumatic   15 minutes    Vasopnuematic Location   Knee    Vasopneumatic Pressure  Low    Vasopneumatic Temperature   34      Manual Therapy   Manual Therapy  Joint mobilization;Passive ROM    Joint Mobilization  patellar mobilization superior/inferior and medial glides    Passive ROM  Right knee flexion in sitting, knee extension in supine             PT Education - 04/18/19 0944    Education Details  POC, therapy progression/timeframe    Person(s) Educated  Patient    Methods  Explanation    Comprehension  Verbalized understanding       PT Short Term Goals - 04/14/19 1319      PT SHORT TERM GOAL #1   Title  Pt will be independent with initial HEP    Baseline  no knowledge of ex    Time  4    Period  Weeks    Status  New    Target Date  05/12/19      PT SHORT TERM GOAL #2   Title  AROM of knee extension -10 to 100 flexion to increase mobility for transitional movements    Baseline  eval RT knee flexion 36  PROM 55    Time  4    Period  Weeks    Status  New    Target Date  05/12/19      PT SHORT TERM GOAL #3   Title  Demonstrate and verbalize understanding of condition management including RICE, positioning, use of A.D., HEP.    Time  4    Period  Weeks    Status  New    Target Date  05/12/19        PT Long Term Goals - 04/14/19 1303      PT LONG TERM GOAL #1   Title  Pt will be independent with advanced HEP.    Baseline  no knowledge    Time  8    Period  Weeks    Status  New    Target Date  06/09/19      PT LONG TERM GOAL #2   Title  Pt will improve R knee extensor/flexor strength to >/= 4+/5 without excerbating  pain greater than 2/10  to promote safety with walking/standing activities     Baseline  unable to walk without cane and antalgic gait    Time  8    Period  Weeks    Status  New    Target Date  06/09/19      PT LONG TERM GOAL #3   Title  PT with be able to walk/stand >/= 1 hour with no AD with </= 2/10 pain for functional endurance and return to leisure activities post DC including walking dog    Baseline  Pt cannot stand longer than 5-10 minutes and must use a cane    Time  8    Period  Weeks    Status  New    Target Date  06/09/19      PT LONG TERM GOAL #4   Title  FOTO will improve from  68% limitation  to  38% limitation     indicating improved functional mobility .    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 Still unable to perform SLR due to quad weakness/limited ability quad activivation.    Stability/Clinical Decision Making  Stable/Uncomplicated    Clinical Decision Making  Low    Rehab Potential  Good    PT Frequency  2x / week   3x/week for first week then 2x/week   PT Duration  8 weeks    PT Treatment/Interventions  ADLs/Self Care Home Management;Electrical  Stimulation;Cryotherapy;Moist Heat;Ultrasound;Gait training;Stair training;Functional mobility training;Therapeutic activities;Therapeutic exercise;Neuromuscular re-education;Patient/family education;Scar mobilization;Passive range of motion;Manual techniques;Dry needling;Taping;Vasopneumatic Device    PT Next Visit Plan  ACL week 3 ( surgery 03-31-19) continue strengthening, work on quad activation, knee ROM, modalities prn-consider trial [place] estim-assisted quad sets if appropriate size electrodes are available (unavailable at today's visit)    PT Home Exercise Plan  quad set.  knee flexion on wall, heel slides with strap mini squats at counter,  wt shifting    Consulted and Agree with Plan of Care  Patient       Patient will benefit from skilled therapeutic intervention in order to improve the following deficits and impairments:  Abnormal gait, Decreased activity tolerance, Decreased mobility, Decreased range of motion, Decreased strength, Increased edema, Difficulty walking, Pain  Visit Diagnosis: Acute pain of right knee  Difficulty in walking, not elsewhere classified  Muscle weakness (generalized)  Localized edema     Problem List There are no problems to display for this patient.   Beaulah Dinning, PT, DPT 04/18/19 10:14 AM  [facility] [address] [place], [place], [address] Phone: [phone]   Fax:  [phone]  Name: Kathleen Duffy MRN: [id number] Date of Birth: 1990/03/29

## 2019-04-20 ENCOUNTER — Other Ambulatory Visit: Payer: Self-pay

## 2019-04-20 ENCOUNTER — Ambulatory Visit: Payer: Self-pay | Admitting: Physical Therapy

## 2019-04-20 ENCOUNTER — Encounter: Payer: Self-pay | Admitting: Physical Therapy

## 2019-04-20 DIAGNOSIS — M25561 Pain in right knee: Secondary | ICD-10-CM

## 2019-04-20 DIAGNOSIS — R262 Difficulty in walking, not elsewhere classified: Secondary | ICD-10-CM

## 2019-04-20 DIAGNOSIS — R600 Localized edema: Secondary | ICD-10-CM

## 2019-04-20 DIAGNOSIS — M6281 Muscle weakness (generalized): Secondary | ICD-10-CM

## 2019-04-20 NOTE — Therapy (Signed)
[facility] [address] [place], [place], [address] Phone: [phone]   Fax:  [phone]  Physical Therapy Treatment  Patient Details  Name: Kathleen Duffy MRN: [id number] Date of Birth: [birth year] Referring Provider (PT): [place], [place] DO   (ACL surgery Dr Elsie Lincoln  03-31-19)   Encounter Date: 04/20/2019  PT End of Session - 04/20/19 0946    Visit Number  3    Number of Visits  17    Date for PT Re-Evaluation  06/09/19    Authorization Type  [employer]    PT Start Time  [phone]   pt. arrived a few minutes late   PT Stop Time  1024    PT Time Calculation (min)  46 min    Activity Tolerance  Patient tolerated treatment well    Behavior During Therapy  [facility] for tasks assessed/performed       History reviewed. No pertinent past medical history.  History reviewed. No pertinent surgical history.  There were no vitals filed for this visit.  Subjective Assessment - 04/20/19 0939    Subjective  No new complaints/concerns since last visit. Next follow up with Dr. Jerl Santos is next Monday 04/25/19.         [facility] PT Assessment - 04/20/19 0001      PROM   Right Knee Flexion  95                   [facility] Adult PT Treatment/Exercise - 04/20/19 0001      Exercises   Exercises  Knee/Hip      Knee/Hip Exercises: Stretches   Passive Hamstring Stretch  Right;3 reps;30 seconds    Gastroc Stretch  Right;3 reps;30 seconds      Knee/Hip Exercises: Standing   Heel Raises  Both;20 reps    Heel Raises Limitations  Airex    Hip Flexion  AROM;Stengthening;Right;2 sets;10 reps;Knee straight   standing front SLR   Hip Flexion Limitations  standing marches on Airex 2x10, standing front SLR 2x10    Terminal Knee Extension  AROM;Strengthening;Right;2 sets;10 reps    Theraband Level (Terminal Knee Extension)  Level 3 (Green)    Hip Abduction  AROM;Stengthening;Right;2 sets;10 reps;Knee straight    Hip Extension   AROM;Stengthening;Right;2 sets;10 reps;Knee straight    Other Standing Knee Exercises  mini squat at counter 2x10    Other Standing Knee Exercises  standing weighshifts lateral and fw x 20 ea.      Knee/Hip Exercises: Supine   Quad Sets  AROM;Strengthening;Right;20 reps    Quad Sets Limitations  5 second holds with towel under knee for cueing    Heel Slides  AAROM;Right;15 reps    Straight Leg Raises Limitations  unable due to quad lag      Modalities   Modalities  Cryotherapy      Cryotherapy   Number Minutes Cryotherapy  10 Minutes    Cryotherapy Location  Knee    Type of Cryotherapy  Ice pack   applied instead of vaso per pt. request     Manual Therapy   Joint Mobilization  patellar mobilization superior/inferior and medial glides    Passive ROM  Right knee flexion in sitting, knee extension in supine             PT Education - 04/20/19 1017    Education Details  exercises, POC    Person(s) Educated  Patient    Methods  Explanation;Demonstration;Verbal cues    Comprehension  Verbalized understanding;Returned  demonstration       PT Short Term Goals - 04/14/19 1319      PT SHORT TERM GOAL #1   Title  Pt will be independent with initial HEP    Baseline  no knowledge of ex    Time  4    Period  Weeks    Status  New    Target Date  05/12/19      PT SHORT TERM GOAL #2   Title  AROM of knee extension -10 to 100 flexion to increase mobility for transitional movements    Baseline  eval RT knee flexion 36  PROM 55    Time  4    Period  Weeks    Status  New    Target Date  05/12/19      PT SHORT TERM GOAL #3   Title  Demonstrate and verbalize understanding of condition management including RICE, positioning, use of A.D., HEP.    Time  4    Period  Weeks    Status  New    Target Date  05/12/19        PT Long Term Goals - 04/14/19 1303      PT LONG TERM GOAL #1   Title  Pt will be independent with advanced HEP.    Baseline  no knowledge    Time  8     Period  Weeks    Status  New    Target Date  06/09/19      PT LONG TERM GOAL #2   Title  Pt will improve R knee extensor/flexor strength to >/= 4+/5 without excerbating  pain greater than 2/10  to promote safety with walking/standing activities    Baseline  unable to walk without cane and antalgic gait    Time  8    Period  Weeks    Status  New    Target Date  06/09/19      PT LONG TERM GOAL #3   Title  PT with be able to walk/stand >/= 1 hour with no AD with </= 2/10 pain for functional endurance and return to leisure activities post DC including walking dog    Baseline  Pt cannot stand longer than 5-10 minutes and must use a cane    Time  8    Period  Weeks    Status  New    Target Date  06/09/19      PT LONG TERM GOAL #4   Title  FOTO will improve from  68% limitation  to  38% limitation     indicating improved functional mobility .    Baseline  eval 04-14-19 68% limitation    Time  8    Period  Weeks    Status  New    Target Date  06/09/19      PT LONG TERM GOAL #5   Title  Pt will improve her R knee flexion to  >/= 120 degrees and extension to </= 5 degrees with </= 2/10 pain for a more functional and efficient gait pattern    Baseline  AROM RT knee 36/ PROM 55    Time  8    Period  Weeks    Status  New    Target Date  06/09/19      Additional Long Term Goals   Additional Long Term Goals  Yes      PT LONG TERM GOAL #6   Title  Pt will be  able to carry up to 50lb in order to return to work at [employer] for work    Baseline  unable to walk without AD or carry only minimal weight    Time  8    Period  Weeks    Status  New    Target Date  06/09/19            Plan - 04/20/19 0948    Clinical Impression Statement  Continues with quad lag/unable to perform supine SLR due to difficulty with quad activation. Still with stiffness limiting right knee flexion>extension ROM but improving with stiffness from baseline status.    Stability/Clinical  Decision Making  Stable/Uncomplicated    Clinical Decision Making  Low    Rehab Potential  Good    PT Frequency  2x / week   3x/week x 1 week then 2x/week   PT Duration  8 weeks    PT Treatment/Interventions  ADLs/Self Care Home Management;Electrical Stimulation;Cryotherapy;Moist Heat;Ultrasound;Gait training;Stair training;Functional mobility training;Therapeutic activities;Therapeutic exercise;Neuromuscular re-education;Patient/family education;Scar mobilization;Passive range of motion;Manual techniques;Dry needling;Taping;Vasopneumatic Device    PT Next Visit Plan  ACL week 3 ( surgery 03-31-19) continue strengthening, work on quad activation, knee ROM, modalities prn-consider trial [place] estim-assisted quad sets if appropriate size electrodes are available (unavailable at today's visit)    PT Home Exercise Plan  quad set.  knee flexion on wall, heel slides with strap mini squats at counter,  wt shifting    Consulted and Agree with Plan of Care  Patient       Patient will benefit from skilled therapeutic intervention in order to improve the following deficits and impairments:  Abnormal gait, Decreased activity tolerance, Decreased mobility, Decreased range of motion, Decreased strength, Increased edema, Difficulty walking, Pain  Visit Diagnosis: Acute pain of right knee  Difficulty in walking, not elsewhere classified  Muscle weakness (generalized)  Localized edema     Problem List There are no problems to display for this patient.   Lazarus Gowda, PT, DPT 04/20/19 10:19 AM  [facility] [address] [place], [place], [address] Phone: [phone]   Fax:  [phone]  Name: Emmarae Cowdery MRN: [id number] Date of Birth: [birth year]

## 2019-04-22 ENCOUNTER — Ambulatory Visit: Payer: Self-pay | Admitting: Physical Therapy

## 2019-04-22 ENCOUNTER — Other Ambulatory Visit: Payer: Self-pay

## 2019-04-22 ENCOUNTER — Encounter: Payer: Self-pay | Admitting: Physical Therapy

## 2019-04-22 DIAGNOSIS — M6281 Muscle weakness (generalized): Secondary | ICD-10-CM

## 2019-04-22 DIAGNOSIS — M25561 Pain in right knee: Secondary | ICD-10-CM

## 2019-04-22 DIAGNOSIS — R600 Localized edema: Secondary | ICD-10-CM

## 2019-04-22 DIAGNOSIS — R262 Difficulty in walking, not elsewhere classified: Secondary | ICD-10-CM

## 2019-04-22 NOTE — Therapy (Signed)
[facility] [address] [place], [place], [address] Phone: [phone]   Fax:  [phone]  Physical Therapy Treatment  Patient Details  Name: Kathleen Duffy MRN: [id number] Date of Birth: 1990-05-05 Referring Provider (PT): [place], [place] DO   (ACL surgery Dr Elsie Lincoln  03-31-19)   Encounter Date: 04/22/2019  PT End of Session - 04/22/19 1057    Visit Number  4    Number of Visits  17    Date for PT Re-Evaluation  06/09/19    Authorization Type  [employer]igna    PT Start Time  1045    PT Stop Time  1130    PT Time Calculation (min)  45 min    Activity Tolerance  Patient tolerated treatment well    Behavior During Therapy  [facility] for tasks assessed/performed       History reviewed. No pertinent past medical history.  History reviewed. No pertinent surgical history.  There were no vitals filed for this visit.  Subjective Assessment - 04/22/19 1051    Subjective  Pt arriving to therpay today reporting 3/10 R knee pain across joint line. Pt scheduled to see Dr. Jerl Santos on 04/25/2019.    Pertinent History  nothing remarkable    Limitations  Standing;Walking;House hold activities    How long can you sit comfortably?  unlimited    How long can you stand comfortably?  5-10 min    How long can you walk comfortably?  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  Yes    Pain Score  3     Pain Location  Knee    Pain Orientation  Right    Pain Descriptors / Indicators  Aching    Pain Type  Acute pain;Surgical pain    Pain Onset  1 to 4 weeks ago         [facility] PT Assessment - 04/22/19 0001      AROM   Right Knee Extension  -6    Right Knee Flexion  60      PROM   Right Knee Extension  -4    Right Knee Flexion  95                   [facility] Adult PT Treatment/Exercise - 04/22/19 0001      Exercises   Exercises  Knee/Hip      Knee/Hip  Exercises: Stretches   Passive Hamstring Stretch  Right;3 reps;30 seconds    Gastroc Stretch  Right;3 reps;30 seconds      Knee/Hip Exercises: Standing   Heel Raises  Both;20 reps    Hip Flexion  AROM;Stengthening;Right;2 sets;10 reps;Knee straight   standing front SLR   Hip Flexion Limitations  standing marches on Airex 2x10, standing front SLR 2x10    Terminal Knee Extension  AROM;Strengthening;Right;2 sets;10 reps    Theraband Level (Terminal Knee Extension)  Level 3 (Green)    Hip Abduction  AROM;Stengthening;Right;2 sets;10 reps;Knee straight    Hip Extension  AROM;Stengthening;Right;2 sets;10 reps;Knee straight    Other Standing Knee Exercises  mini squat at counter 2x10    Other Standing Knee Exercises  sit to stand x 10, and again with L LE forward to place more weight on R LE. , lateral weight shifting x 20 reps      Knee/Hip Exercises: Sidelying  Hip ADduction  Strengthening;Right;10 reps      Modalities   Modalities  Cryotherapy      Cryotherapy   Number Minutes Cryotherapy  10 Minutes    Cryotherapy Location  Knee    Type of Cryotherapy  Ice pack      Manual Therapy   Joint Mobilization  patella mobs    Passive ROM  R knee fleixon/extension                PT Short Term Goals - 04/22/19 1120      PT SHORT TERM GOAL #1   Title  Pt will be independent with initial HEP    Time  4    Period  Weeks    Status  On-going    Target Date  05/12/19      PT SHORT TERM GOAL #2   Title  AROM of knee extension -10 to 100 flexion to increase mobility for transitional movements    Period  Weeks    Status  On-going      PT SHORT TERM GOAL #3   Title  Demonstrate and verbalize understanding of condition management including RICE, positioning, use of A.D., HEP.    Period  Weeks    Status  On-going        PT Long Term Goals - 04/14/19 1303      PT LONG TERM GOAL #1   Title  Pt will be independent with advanced HEP.    Baseline  no knowledge    Time  8     Period  Weeks    Status  New    Target Date  06/09/19      PT LONG TERM GOAL #2   Title  Pt will improve R knee extensor/flexor strength to >/= 4+/5 without excerbating  pain greater than 2/10  to promote safety with walking/standing activities    Baseline  unable to walk without cane and antalgic gait    Time  8    Period  Weeks    Status  New    Target Date  06/09/19      PT LONG TERM GOAL #3   Title  PT with be able to walk/stand >/= 1 hour with no AD with </= 2/10 pain for functional endurance and return to leisure activities post DC including walking dog    Baseline  Pt cannot stand longer than 5-10 minutes and must use a cane    Time  8    Period  Weeks    Status  New    Target Date  06/09/19      PT LONG TERM GOAL #4   Title  FOTO will improve from  68% limitation  to  38% limitation     indicating improved functional mobility .    Baseline  eval 04-14-19 68% limitation    Time  8    Period  Weeks    Status  New    Target Date  06/09/19      PT LONG TERM GOAL #5   Title  Pt will improve her R knee flexion to  >/= 120 degrees and extension to </= 5 degrees with </= 2/10 pain for a more functional and efficient gait pattern    Baseline  AROM RT knee 36/ PROM 55    Time  8    Period  Weeks    Status  New    Target Date  06/09/19  Additional Long Term Goals   Additional Long Term Goals  Yes      PT LONG TERM GOAL #6   Title  Pt will be able to carry up to 50lb in order to return to work at [employer] center for work    Baseline  unable to walk without AD or carry only minimal weight    Time  8    Period  Weeks    Status  New    Target Date  06/09/19            Plan - 04/22/19 1118    Clinical Impression Statement  Pt continuing to make slow progress with ROM. Pt still with decreased quad activation/ strength with lag noted when attempting SLR. Recommending VMS or [place] Stim at next visit. Pt tolerating all exericses well and reporting  compliance with her HEP. Continue skilled PT progressing toward goals set.    Stability/Clinical Decision Making  Stable/Uncomplicated    Rehab Potential  Good    PT Frequency  2x / week    PT Duration  8 weeks    PT Treatment/Interventions  ADLs/Self Care Home Management;Electrical Stimulation;Cryotherapy;Moist Heat;Ultrasound;Gait training;Stair training;Functional mobility training;Therapeutic activities;Therapeutic exercise;Neuromuscular re-education;Patient/family education;Scar mobilization;Passive range of motion;Manual techniques;Dry needling;Taping;Vasopneumatic Device    PT Next Visit Plan  ACL week 3 ( surgery 03-31-19) continue strengthening, work on quad activation, knee ROM, modalities prn-consider trial [place] estim-assisted quad sets if appropriate size electrodes are available (unavailable at today's visit)    PT Home Exercise Plan  quad set.  knee flexion on wall, heel slides with strap mini squats at counter,  wt shifting    Consulted and Agree with Plan of Care  Patient       Patient will benefit from skilled therapeutic intervention in order to improve the following deficits and impairments:  Abnormal gait, Decreased activity tolerance, Decreased mobility, Decreased range of motion, Decreased strength, Increased edema, Difficulty walking, Pain  Visit Diagnosis: Acute pain of right knee  Difficulty in walking, not elsewhere classified  Muscle weakness (generalized)  Localized edema     Problem List There are no problems to display for this patient.   Oretha Caprice, PT 04/22/2019, 11:25 AM  [facility] [address] [place], [place], [address] Phone: [phone]   Fax:  [phone]  Name: Kathleen Duffy MRN: [id number] Date of Birth: 1990-07-02

## 2019-04-25 ENCOUNTER — Encounter: Payer: Self-pay | Admitting: Physical Therapy

## 2019-04-25 ENCOUNTER — Ambulatory Visit: Payer: Self-pay | Admitting: Physical Therapy

## 2019-04-25 ENCOUNTER — Other Ambulatory Visit: Payer: Self-pay

## 2019-04-25 DIAGNOSIS — M6281 Muscle weakness (generalized): Secondary | ICD-10-CM

## 2019-04-25 DIAGNOSIS — R262 Difficulty in walking, not elsewhere classified: Secondary | ICD-10-CM

## 2019-04-25 DIAGNOSIS — M25561 Pain in right knee: Secondary | ICD-10-CM

## 2019-04-25 DIAGNOSIS — R600 Localized edema: Secondary | ICD-10-CM

## 2019-04-25 NOTE — Therapy (Signed)
[facility] [address] [place], [place], [address] Phone: [phone]   Fax:  [phone]  Physical Therapy Treatment  Patient Details  Name: Kathleen Duffy MRN: [id number] Date of Birth: 1990-08-28 Referring Provider (PT): [place], [place] DO   (ACL surgery Dr Elsie Lincoln  03-31-19)   Encounter Date: 04/25/2019  PT End of Session - 04/25/19 0930    Visit Number  5    Number of Visits  17    Date for PT Re-Evaluation  06/09/19    Authorization Type  Cigna    PT Start Time  0921    PT Stop Time  1000    PT Time Calculation (min)  39 min    Activity Tolerance  Patient tolerated treatment well    Behavior During Therapy  [facility] for tasks assessed/performed       History reviewed. No pertinent past medical history.  History reviewed. No pertinent surgical history.  There were no vitals filed for this visit.  Subjective Assessment - 04/25/19 0930    Subjective  Pt arriving to [facility]pay today reporting no pain in R knee. Pt scheduled to see Dr. Jerl Santos today.    Pertinent History  nothing remarkable    Limitations  Standing;Walking;House hold activities    How long can you sit comfortably?  unlimited    How long can you walk comfortably?  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  No/denies         [facility] PT Assessment - 04/25/19 0001      AROM   Right Knee Extension  5    Right Knee Flexion  100      PROM   Right Knee Extension  3    Right Knee Flexion  110                   [facility] Adult PT Treatment/Exercise - 04/25/19 0001      Exercises   Exercises  Knee/Hip      Knee/Hip Exercises: Stretches   Passive Hamstring Stretch  Right    Gastroc Stretch  Right;3 reps;30 seconds      Knee/Hip Exercises: Aerobic   Recumbent Bike  rocking back and forth until pt was able to make full revolution x 5 minutes      Knee/Hip  Exercises: Standing   Heel Raises  Both;20 reps    Heel Raises Limitations  airex    Hip Flexion  AROM;Stengthening;Right;2 sets;10 reps;Knee straight   standing front SLR   Hip Flexion Limitations  standing marching on airex    Terminal Knee Extension  AROM;Strengthening;Right;2 sets;10 reps    Theraband Level (Terminal Knee Extension)  Level 3 (Green)    Hip Abduction  AROM;Stengthening;Right;2 sets;10 reps;Knee straight    Hip Extension  AROM;Stengthening;Right;2 sets;10 reps;Knee straight    Other Standing Knee Exercises  mini squat at counter 2x10    Other Standing Knee Exercises  weight shifting in side lunge position       Knee/Hip Exercises: Supine   Quad Sets  AROM;Strengthening;15 reps    Straight Leg Raises Limitations  5 degrees extensor lag noted, pt instructed to perform QS prior to listing. Pt making progress from last visits      Manual Therapy   Manual Therapy  Passive ROM    Joint Mobilization  patella mobs  Passive ROM  R knee flexion               PT Short Term Goals - 04/25/19 1000      PT SHORT TERM GOAL #1   Title  Pt will be independent with initial HEP    Status  On-going    Target Date  05/12/19      PT SHORT TERM GOAL #2   Title  AROM of knee extension -10 to 100 flexion to increase mobility for transitional movements    Baseline  PROM: 3- 110 degrees, AROM 5-100 degrees    Time  4    Period  Weeks    Status  Achieved    Target Date  05/12/19      PT SHORT TERM GOAL #3   Title  Demonstrate and verbalize understanding of condition management including RICE, positioning, use of A.D., HEP.    Time  4    Status  On-going    Target Date  05/12/19        PT Long Term Goals - 04/25/19 0936      PT LONG TERM GOAL #1   Title  Pt will be independent with advanced HEP.    Baseline  no knowledge    Time  8    Period  Weeks    Status  New      PT LONG TERM GOAL #2   Title  Pt will improve R knee extensor/flexor strength to >/= 4+/5  without excerbating  pain greater than 2/10  to promote safety with walking/standing activities    Baseline  unable to walk without cane and antalgic gait    Period  Weeks      PT LONG TERM GOAL #3   Title  PT with be able to walk/stand >/= 1 hour with no AD with </= 2/10 pain for functional endurance and return to leisure activities post DC including walking dog    Baseline  pt progressing toward walking on level surfaces short distances with no device    Time  8    Period  Weeks    Status  On-going      PT LONG TERM GOAL #4   Title  FOTO will improve from  68% limitation  to  38% limitation     indicating improved functional mobility .    Baseline  eval 04-14-19 68% limitation    Time  8    Period  Weeks    Status  New      PT LONG TERM GOAL #5   Title  Pt will improve her R knee flexion to  >/= 120 degrees and extension to </= 5 degrees with </= 2/10 pain for a more functional and efficient gait pattern    Time  8    Period  Weeks    Status  On-going      PT LONG TERM GOAL #6   Title  Pt will be able to carry up to 50lb in order to return to work at [employer] center for work    Baseline  unable to walk without AD or carry only minimal weight    Period  Weeks    Status  On-going            Plan - 04/25/19 0932    Clinical Impression Statement  Pt continuing to make progress with ROM. Pt still presenting with decreased quad activation/strength. Pt with  5 degrees extensor lag noted  with initial SLR. Pt instructed to perform quad set prior to lift. Pt instructed in heel to toe gait on level surface with no device. VMS deferred today due to better quad activitaiton.  Pt still advised to use straight cane when amb on uneven surfaces for support. Pt tolerating exercises well. Continue skilled PT.    Stability/Clinical Decision Making  Stable/Uncomplicated    Rehab Potential  Good    PT Frequency  2x / week    PT Duration  8 weeks    PT Treatment/Interventions   ADLs/Self Care Home Management;Electrical Stimulation;Cryotherapy;Moist Heat;Ultrasound;Gait training;Stair training;Functional mobility training;Therapeutic activities;Therapeutic exercise;Neuromuscular re-education;Patient/family education;Scar mobilization;Passive range of motion;Manual techniques;Dry needling;Taping;Vasopneumatic Device    PT Next Visit Plan  ACL week 3 ( surgery 03-31-19) continue strengthening, work on quad activation, knee ROM, modalities prn-consider trial [place] estim-assisted quad sets if appropriate size electrodes are available (unavailable at today's visit)    PT Home Exercise Plan  quad set.  knee flexion on wall, heel slides with strap mini squats at counter,  wt shifting       Patient will benefit from skilled therapeutic intervention in order to improve the following deficits and impairments:  Abnormal gait, Decreased activity tolerance, Decreased mobility, Decreased range of motion, Decreased strength, Increased edema, Difficulty walking, Pain  Visit Diagnosis: Acute pain of right knee  Difficulty in walking, not elsewhere classified  Muscle weakness (generalized)  Localized edema     Problem List There are no problems to display for this patient.   Oretha Caprice, PT 04/25/2019, 10:02 AM  [facility] [address] [place], [place], [address] Phone: [phone]   Fax:  [phone]  Name: Kathleen Duffy MRN: [id number] Date of Birth: Apr 17, 1990

## 2019-04-27 ENCOUNTER — Ambulatory Visit: Payer: Self-pay | Admitting: Physical Therapy

## 2019-04-29 ENCOUNTER — Other Ambulatory Visit: Payer: Self-pay

## 2019-04-29 ENCOUNTER — Encounter: Payer: Self-pay | Admitting: Physical Therapy

## 2019-04-29 ENCOUNTER — Ambulatory Visit: Payer: Self-pay | Admitting: Physical Therapy

## 2019-04-29 DIAGNOSIS — M6281 Muscle weakness (generalized): Secondary | ICD-10-CM

## 2019-04-29 DIAGNOSIS — R262 Difficulty in walking, not elsewhere classified: Secondary | ICD-10-CM

## 2019-04-29 DIAGNOSIS — R600 Localized edema: Secondary | ICD-10-CM

## 2019-04-29 DIAGNOSIS — M25561 Pain in right knee: Secondary | ICD-10-CM

## 2019-04-29 NOTE — Therapy (Signed)
[place], [place], [address] Phone: [phone]   Fax:  [phone]  Physical Therapy Treatment  Patient Details  Name: Kathleen Duffy MRN: [id number] Date of Birth: 1991/02/04 Referring Provider (PT): [place], [place] DO   (ACL surgery Dr Christena Flake  03-31-19)   Encounter Date: 04/29/2019  PT End of Session - 04/29/19 0932    Visit Number  6    Number of Visits  17    Date for PT Re-Evaluation  06/09/19    Authorization Type  [employer]    PT Start Time  0853    PT Stop Time  0938    PT Time Calculation (min)  45 min    Activity Tolerance  Patient tolerated treatment well    Behavior During Therapy  [facility] for tasks assessed/performed       History reviewed. No pertinent past medical history.  History reviewed. No pertinent surgical history.  There were no vitals filed for this visit.  Subjective Assessment - 04/29/19 0855    Subjective  Pt. had follow up with Dr. Rhona Raider and reports MD pleased with healing/status so far. Using SPC to ambulate.    Currently in Pain?  Yes    Pain Score  2     Pain Location  Knee    Pain Orientation  Right    Pain Descriptors / Indicators  Aching    Pain Type  Acute pain;Surgical pain    Pain Onset  1 to 4 weeks ago    Pain Frequency  Constant    Aggravating Factors   movement                       [facility] Adult PT Treatment/Exercise - 04/29/19 0001      Knee/Hip Exercises: Aerobic   Recumbent Bike  L1 x 6 min initial partial revolutions progressed after approximately 2 minutes to full revolutions      Knee/Hip Exercises: Standing   Heel Raises  Both;20 reps   airex unavailable   Hip Flexion Limitations  standing marches x 20    Terminal Knee Extension  AROM;Strengthening;Right;2 sets;10 reps    Theraband Level (Terminal Knee Extension)  Level 4 (Blue)    Hip Abduction  AROM;Stengthening;Right;Left;2 sets;10 reps    Lateral Step Up  Right;2  sets;10 reps;Hand Hold: 2;Step Height: 2"    Forward Step Up  Right;2 sets;10 reps;Hand Hold: 2;Step Height: 4"    Functional Squat  2 sets;10 reps    Functional Squat Limitations  partial squats at counter      Knee/Hip Exercises: Supine   Quad Sets Limitations  quad sets performed with [place] estim-see under modalities      Modalities   Modalities  Teacher, English as a foreign language Location  Right quadricep    Printmaker Action  Russian   estim assisted quad sets   Electrical Stimulation Parameters  10 sec/10 sec to tolerance    Electrical Stimulation Goals  Strength;Neuromuscular facilitation      Manual Therapy   Passive ROM  R knee flexion               PT Short Term Goals - 04/25/19 1000      PT SHORT TERM GOAL #1   Title  Pt will be independent with initial HEP    Status  On-going    Target Date  05/12/19  PT SHORT TERM GOAL #2   Title  AROM of knee extension -10 to 100 flexion to increase mobility for transitional movements    Baseline  PROM: 3- 110 degrees, AROM 5-100 degrees    Time  4    Period  Weeks    Status  Achieved    Target Date  05/12/19      PT SHORT TERM GOAL #3   Title  Demonstrate and verbalize understanding of condition management including RICE, positioning, use of A.D., HEP.    Time  4    Status  On-going    Target Date  05/12/19        PT Long Term Goals - 04/25/19 0936      PT LONG TERM GOAL #1   Title  Pt will be independent with advanced HEP.    Baseline  no knowledge    Time  8    Period  Weeks    Status  New      PT LONG TERM GOAL #2   Title  Pt will improve R knee extensor/flexor strength to >/= 4+/5 without excerbating  pain greater than 2/10  to promote safety with walking/standing activities    Baseline  unable to walk without cane and antalgic gait    Period  Weeks      PT LONG TERM GOAL #3   Title  PT with be able to walk/stand >/= 1 hour with no AD  with </= 2/10 pain for functional endurance and return to leisure activities post DC including walking dog    Baseline  pt progressing toward walking on level surfaces short distances with no device    Time  8    Period  Weeks    Status  On-going      PT LONG TERM GOAL #4   Title  FOTO will improve from  68% limitation  to  38% limitation     indicating improved functional mobility .    Baseline  eval 04-14-19 68% limitation    Time  8    Period  Weeks    Status  New      PT LONG TERM GOAL #5   Title  Pt will improve her R knee flexion to  >/= 120 degrees and extension to </= 5 degrees with </= 2/10 pain for a more functional and efficient gait pattern    Time  8    Period  Weeks    Status  On-going      PT LONG TERM GOAL #6   Title  Pt will be able to carry up to 50lb in order to return to work at [employer] for work    Baseline  unable to walk without AD or carry only minimal weight    Period  Weeks    Status  On-going            Plan - 04/29/19 0907    Clinical Impression Statement  ROM continues to improve with ability full revolutions on bike. Still with quad weakness but improving ability supine SLR though still with mild quad lag. Electrodes received so trial [place] estim assisted quad sets with good tolerance. Progressed closed chain activities with step ups (2 and 4 in. steps) with good tolerance. Need for continued strengthening and ROM will be ongoing to address functional limitations.    Stability/Clinical Decision Making  Stable/Uncomplicated    Clinical Decision Making  Low    Rehab Potential  Good  PT Frequency  2x / week    PT Duration  8 weeks    PT Treatment/Interventions  ADLs/Self Care Home Management;Electrical Stimulation;Cryotherapy;Moist Heat;Ultrasound;Gait training;Stair training;Functional mobility training;Therapeutic activities;Therapeutic exercise;Neuromuscular re-education;Patient/family education;Scar mobilization;Passive  range of motion;Manual techniques;Dry needling;Taping;Vasopneumatic Device    PT Next Visit Plan  ACL week 3 ( surgery 03-31-19) continue strengthening and progress closed chain activities as tolerated, continue estim to assist quad activation, as quad control improves add further balance/proprioceotive challenges    PT Home Exercise Plan  quad set.  knee flexion on wall, heel slides with strap mini squats at counter,  wt shifting    Consulted and Agree with Plan of Care  Patient       Patient will benefit from skilled therapeutic intervention in order to improve the following deficits and impairments:  Abnormal gait, Decreased activity tolerance, Decreased mobility, Decreased range of motion, Decreased strength, Increased edema, Difficulty walking, Pain  Visit Diagnosis: Acute pain of right knee  Difficulty in walking, not elsewhere classified  Muscle weakness (generalized)  Localized edema     Problem List There are no problems to display for this patient.   Lazarus Gowda, PT, DPT 04/29/19 9:34 AM  [facility] [address] [place], [place], [address] Phone: [phone]   Fax:  [phone]  Name: Kathleen Duffy MRN: [id number] Date of Birth: 1990-07-08

## 2019-05-02 ENCOUNTER — Ambulatory Visit: Payer: Self-pay | Admitting: Physical Therapy

## 2019-05-04 ENCOUNTER — Ambulatory Visit: Payer: Self-pay | Admitting: Physical Therapy

## 2019-05-04 ENCOUNTER — Encounter: Payer: Self-pay | Admitting: Physical Therapy

## 2019-05-04 ENCOUNTER — Other Ambulatory Visit: Payer: Self-pay

## 2019-05-04 DIAGNOSIS — M25561 Pain in right knee: Secondary | ICD-10-CM

## 2019-05-04 DIAGNOSIS — M6281 Muscle weakness (generalized): Secondary | ICD-10-CM

## 2019-05-04 DIAGNOSIS — R262 Difficulty in walking, not elsewhere classified: Secondary | ICD-10-CM

## 2019-05-04 DIAGNOSIS — R600 Localized edema: Secondary | ICD-10-CM

## 2019-05-04 NOTE — Therapy (Signed)
[facility] [address] [place], [place], [address] Phone: [phone]   Fax:  [phone]  Physical Therapy Treatment  Patient Details  Name: Kathleen Duffy MRN: [id number] Date of Birth: [birth year] Referring Provider (PT): [place], [place] DO   (ACL surgery Dr Elsie Lincoln  03-31-19)   Encounter Date: 05/04/2019  PT End of Session - 05/04/19 1013    Visit Number  7    Number of Visits  17    Date for PT Re-Evaluation  06/09/19    Authorization Type  [employer]ign[employer]    PT Start Time  1005    PT Stop Time  1055    PT Time Calculation (min)  50 min    Activity Tolerance  Patient tolerated treatment well    Behavior During Therapy  [facility] for tasks assessed/performed       History reviewed. No pertinent past medical history.  History reviewed. No pertinent surgical history.  There were no vitals filed for this visit.  Subjective Assessment - 05/04/19 1011    Subjective  Pt arriving to therpay reporting no pain. Pt still amb with SPC with mild antalgic gait pattern. Pt stating her MD wrote her a letter to return to work 05/16/2019.    Pertinent History  nothing remarkable    Limitations  Standing;Walking;House hold activities    How long can you sit comfortably?  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  No/denies    Pain Onset  1 to 4 weeks ago         [facility] PT Assessment - 05/04/19 0001      AROM   Right Knee Extension  5    Right Knee Flexion  102      PROM   Right Knee Extension  2    Right Knee Flexion  110                   [facility] Adult PT Treatment/Exercise - 05/04/19 0001      Exercises   Exercises  Knee/Hip      Knee/Hip Exercises: Stretches   Gastroc Stretch  Right;3 reps;30 seconds      Knee/Hip Exercises: Aerobic   Recumbent Bike  L1 x 6 minutes pt able to make full revolutions      Knee/Hip Exercises: Standing    Heel Raises  Both;20 reps   airex unavailable   Hip Flexion Limitations  standing marches x 20    Terminal Knee Extension  AROM;Strengthening;Right;2 sets;10 reps    Theraband Level (Terminal Knee Extension)  Level 4 (Blue)    Lateral Step Up  Right;2 sets;10 reps;Hand Hold: 2;Step Height: 4";Step Height: 6"    Forward Step Up  Right;2 sets;10 reps;Hand Hold: 2;Step Height: 4";Step Height: 6"    Functional Squat  2 sets;10 reps    Functional Squat Limitations  partial squats at counter    Other Standing Knee Exercises  side stepping with blue theraband around knees with mini squat      Knee/Hip Exercises: Supine   Quad Sets Limitations  perfomred with [place] E-stim    Straight Leg Raises Limitations  mild extensor lag still noted with attempted SLR      Modalities   Modalities  Electrical Stimulation      Cryotherapy   Number Minutes Cryotherapy  10 Minutes    Cryotherapy  Location  Knee    Type of Cryotherapy  Ice pack      Electrical Stimulation   Electrical Stimulation Location  Right quadricep    Electrical Stimulation Action  Russian   10 minutes   Electrical Stimulation Parameters  10 sec/10 sec,     Electrical Stimulation Goals  Strength;Neuromuscular facilitation      Manual Therapy   Passive ROM  R knee flexion and extension               PT Short Term Goals - 05/04/19 1159      PT SHORT TERM GOAL #1   Title  Pt will be independent with initial HEP    Time  4    Period  Weeks    Status  On-going    Target Date  05/12/19      PT SHORT TERM GOAL #2   Title  AROM of knee extension -10 to 100 flexion to increase mobility for transitional movements    Baseline  PROM: 2- 110 degrees, AROM 5-102 degrees    Time  4    Period  Weeks    Status  Achieved    Target Date  05/12/19      PT SHORT TERM GOAL #3   Title  Demonstrate and verbalize understanding of condition management including RICE, positioning, use of A.D., HEP.    Time  4    Period  Weeks     Status  On-going    Target Date  05/12/19        PT Long Term Goals - 05/04/19 1200      PT LONG TERM GOAL #1   Title  Pt will be independent with advanced HEP.    Period  Weeks    Status  New      PT LONG TERM GOAL #2   Title  Pt will improve R knee extensor/flexor strength to >/= 4+/5 without excerbating  pain greater than 2/10  to promote safety with walking/standing activities    Baseline  amb with straight cane antalgic gait pattern    Time  8    Period  Weeks    Status  New      PT LONG TERM GOAL #3   Title  PT with be able to walk/stand >/= 1 hour with no AD with </= 2/10 pain for functional endurance and return to leisure activities post DC including walking dog    Baseline  pt progressing toward walking on level surfaces short distances with no device    Time  8    Period  Weeks    Status  On-going      PT LONG TERM GOAL #4   Title  FOTO will improve from  68% limitation  to  38% limitation     indicating improved functional mobility .    Baseline  eval 04-14-19 68% limitation    Time  8    Period  Weeks      PT LONG TERM GOAL #5   Title  Pt will improve her R knee flexion to  >/= 120 degrees and extension to </= 5 degrees with </= 2/10 pain for a more functional and efficient gait pattern    Baseline  AROM: 5-102    Time  8    Period  Weeks    Status  On-going      PT LONG TERM GOAL #6   Title  Pt will be able to carry up  to 50lb in order to return to work at [employer] center for work    Baseline  unable to walk without AD or carry only minimal weight    Time  8    Period  Weeks    Status  On-going            Plan - 05/04/19 1148    Clinical Impression Statement  Pt arriving to theapy repoting no pain at rest. Pt still with edema and tightness noted. Pt tolerating treatment well. [place] E-stim during QS to elicit increased quad activation. Pt progressing with standing exericses and step ups. AROM arc:  5-102 degrees. Conitnue with skilled  PT.    Stability/Clinical Decision Making  Stable/Uncomplicated    Rehab Potential  Good    PT Frequency  2x / week    PT Duration  8 weeks    PT Treatment/Interventions  ADLs/Self Care Home Management;Electrical Stimulation;Cryotherapy;Moist Heat;Ultrasound;Gait training;Stair training;Functional mobility training;Therapeutic activities;Therapeutic exercise;Neuromuscular re-education;Patient/family education;Scar mobilization;Passive range of motion;Manual techniques;Dry needling;Taping;Vasopneumatic Device    PT Next Visit Plan  ACL week 3 ( surgery 03-31-19) continue strengthening and progress closed chain activities as tolerated, continue estim to assist quad activation, as quad control improves add further balance/proprioceotive challenges    PT Home Exercise Plan  quad set.  knee flexion on wall, heel slides with strap mini squats at counter,  wt shifting    Consulted and Agree with Plan of Care  Patient       Patient will benefit from skilled therapeutic intervention in order to improve the following deficits and impairments:  Abnormal gait, Decreased activity tolerance, Decreased mobility, Decreased range of motion, Decreased strength, Increased edema, Difficulty walking, Pain  Visit Diagnosis: Acute pain of right knee  Difficulty in walking, not elsewhere classified  Muscle weakness (generalized)  Localized edema     Problem List There are no problems to display for this patient.   Oretha Caprice, PT 05/04/2019, 12:04 PM  [facility] [address] [place], [place], [address] Phone: [phone]   Fax:  [phone]  Name: Sidda Humm MRN: [id number] Date of Birth: 1990/05/05

## 2019-05-06 ENCOUNTER — Other Ambulatory Visit: Payer: Self-pay

## 2019-05-06 ENCOUNTER — Encounter: Payer: Self-pay | Admitting: Physical Therapy

## 2019-05-06 ENCOUNTER — Ambulatory Visit: Payer: Self-pay | Admitting: Physical Therapy

## 2019-05-06 DIAGNOSIS — M6281 Muscle weakness (generalized): Secondary | ICD-10-CM

## 2019-05-06 DIAGNOSIS — M25561 Pain in right knee: Secondary | ICD-10-CM

## 2019-05-06 DIAGNOSIS — R262 Difficulty in walking, not elsewhere classified: Secondary | ICD-10-CM

## 2019-05-06 DIAGNOSIS — R600 Localized edema: Secondary | ICD-10-CM

## 2019-05-06 NOTE — Therapy (Signed)
[place], [place], [address] Phone: [phone]   Fax:  [phone]  Physical Therapy Treatment  Patient Details  Name: Kathleen Duffy MRN: [id number] Date of Birth: October 19, 1990 Referring Provider (PT): [place], [place] DO   (ACL surgery Dr Christena Flake  03-31-19)   Encounter Date: 05/06/2019  PT End of Session - 05/06/19 0936    Visit Number  8    Number of Visits  17    Date for PT Re-Evaluation  06/09/19    Authorization Type  [employer]ign[employer]    PT Start Time  0926    PT Stop Time  1032    PT Time Calculation (min)  66 min    Activity Tolerance  Patient tolerated treatment well    Behavior During Therapy  [facility] for tasks assessed/performed       History reviewed. No pertinent past medical history.  History reviewed. No pertinent surgical history.  There were no vitals filed for this visit.  Subjective Assessment - 05/06/19 0928    Subjective  No pain pre-tx. No new complaints/concerns since last visit.    Currently in Pain?  No/denies         [facility] PT Assessment - 05/06/19 0001      Observation/Other Assessments   Focus on Therapeutic Outcomes (FOTO)   44% limited                   [facility] Adult PT Treatment/Exercise - 05/06/19 0001      Knee/Hip Exercises: Stretches   Passive Hamstring Stretch  Right;3 reps;30 seconds    Other Knee/Hip Stretches  slant board stretch 3x20 sec      Knee/Hip Exercises: Aerobic   Recumbent Bike  L1 x 6 minutes   initial partial revolutions progressed to full     Knee/Hip Exercises: Standing   Heel Raises  Both;20 reps   Airex   Hip Flexion Limitations  standing marches x 20   on Airex   Terminal Knee Extension  AROM;Strengthening;Right;2 sets;10 reps    Theraband Level (Terminal Knee Extension)  Level 4 (Blue)    Lateral Step Up  Right;2 sets;10 reps;Hand Hold: 2;Step Height: 4"    Forward Step Up  Right;2 sets;10 reps;Hand Hold: 1;Step Height: 6"    Functional Squat  2 sets;10 reps    Functional Squat Limitations  TRX partial squat    Rocker Board  2 minutes    Rocker Board Limitations  dynamic balance on blue board x 1 min ea. lateral and fw/rev    Rebounder  feet apart on Airex x 30 throws red 1000 g ball    Other Standing Knee Exercises  hip ext and abd SLR in standing with Blue Theraband proximal to knees 2x10 ea.      Knee/Hip Exercises: Supine   Quad Sets Limitations  performed with [place] estim    Heel Slides Limitations  x 20 with therapist assist/overpressure    Straight Leg Raises  AROM;Strengthening;Right;2 sets;10 reps    Straight Leg Raises Limitations  mild extension lag      Cryotherapy   Number Minutes Cryotherapy  10 Minutes    Cryotherapy Location  Knee    Type of Cryotherapy  Ice pack      Electrical Stimulation   Electrical Stimulation Location  Right quadricep    Electrical Stimulation Action  Engineer, petroleum Parameters  10sec/10 sec    Electrical Stimulation Goals  Strength;Neuromuscular facilitation  PT Short Term Goals - 05/04/19 1159      PT SHORT TERM GOAL #1   Title  Pt will be independent with initial HEP    Time  4    Period  Weeks    Status  On-going    Target Date  05/12/19      PT SHORT TERM GOAL #2   Title  AROM of knee extension -10 to 100 flexion to increase mobility for transitional movements    Baseline  PROM: 2- 110 degrees, AROM 5-102 degrees    Time  4    Period  Weeks    Status  Achieved    Target Date  05/12/19      PT SHORT TERM GOAL #3   Title  Demonstrate and verbalize understanding of condition management including RICE, positioning, use of A.D., HEP.    Time  4    Period  Weeks    Status  On-going    Target Date  05/12/19        PT Long Term Goals - 05/06/19 0931      PT LONG TERM GOAL #1   Title  Pt will be independent with advanced HEP.    Baseline  HEP updates ongoing    Time  8    Period  Weeks    Status   On-going      PT LONG TERM GOAL #2   Title  Pt will improve R knee extensor/flexor strength to >/= 4+/5 without excerbating  pain greater than 2/10  to promote safety with walking/standing activities    Baseline  amb with straight cane antalgic gait pattern    Time  8    Period  Weeks    Status  On-going      PT LONG TERM GOAL #3   Title  PT with be able to walk/stand >/= 1 hour with no AD with </= 2/10 pain for functional endurance and return to leisure activities post DC including walking dog    Baseline  pt progressing toward walking on level surfaces short distances with no device    Time  8    Period  Weeks    Status  On-going      PT LONG TERM GOAL #4   Title  FOTO will improve from  68% limitation  to  38% limitation     indicating improved functional mobility .    Baseline  44% limited 05/06/19    Time  8    Period  Weeks    Status  On-going      PT LONG TERM GOAL #5   Title  Pt will improve her R knee flexion to  >/= 120 degrees and extension to </= 5 degrees with </= 2/10 pain for a more functional and efficient gait pattern    Baseline  AROM: 5-102    Time  8    Period  Weeks    Status  On-going      PT LONG TERM GOAL #6   Title  Pt will be able to carry up to 50lb in order to return to work at [employer] center for work    Baseline  unable to walk without AD or carry only minimal weight    Time  8    Period  Weeks    Status  On-going            Plan - 05/06/19 0936    Clinical Impression Statement  Functionally improving as evidenced by FOTO score gains from baseline but still with quad weakness/mild extension lag and with decreased eccentric control with step down motions. For gait continues with limitations with SPC use.    Stability/Clinical Decision Making  Stable/Uncomplicated    Clinical Decision Making  Low    Rehab Potential  Good    PT Frequency  2x / week    PT Duration  8 weeks    PT Treatment/Interventions  ADLs/Self Care Home  Management;Electrical Stimulation;Cryotherapy;Moist Heat;Ultrasound;Gait training;Stair training;Functional mobility training;Therapeutic activities;Therapeutic exercise;Neuromuscular re-education;Patient/family education;Scar mobilization;Passive range of motion;Manual techniques;Dry needling;Taping;Vasopneumatic Device    PT Next Visit Plan  (s/p right ACL surgery 03-31-19) continue strengthening and progress closed chain activities as tolerated, continue estim to assist quad activation, as quad control improves add further balance/proprioceotive challenges    PT Home Exercise Plan  quad set.  knee flexion on wall, heel slides with strap mini squats at counter,  wt shifting    Consulted and Agree with Plan of Care  Patient       Patient will benefit from skilled therapeutic intervention in order to improve the following deficits and impairments:  Abnormal gait, Decreased activity tolerance, Decreased mobility, Decreased range of motion, Decreased strength, Increased edema, Difficulty walking, Pain  Visit Diagnosis: Acute pain of right knee  Difficulty in walking, not elsewhere classified  Muscle weakness (generalized)  Localized edema     Problem List There are no problems to display for this patient.   Lazarus Gowda, PT, DPT 05/06/19 10:16 AM  [facility] [address] [place], [place], [address] Phone: [phone]   Fax:  [phone]  Name: Kathleen Duffy MRN: [id number] Date of Birth: [birth year]

## 2019-05-09 ENCOUNTER — Encounter: Payer: Self-pay | Admitting: Physical Therapy

## 2019-05-09 ENCOUNTER — Other Ambulatory Visit: Payer: Self-pay

## 2019-05-09 ENCOUNTER — Ambulatory Visit: Payer: Self-pay | Admitting: Physical Therapy

## 2019-05-09 DIAGNOSIS — M6281 Muscle weakness (generalized): Secondary | ICD-10-CM

## 2019-05-09 DIAGNOSIS — R600 Localized edema: Secondary | ICD-10-CM

## 2019-05-09 DIAGNOSIS — M25561 Pain in right knee: Secondary | ICD-10-CM

## 2019-05-09 DIAGNOSIS — R262 Difficulty in walking, not elsewhere classified: Secondary | ICD-10-CM

## 2019-05-09 NOTE — Therapy (Signed)
[facility] [address] [place], [place], [address] Phone: [phone]   Fax:  [phone]  Physical Therapy Treatment  Patient Details  Name: Kathleen Duffy MRN: [id number] Date of Birth: Nov 14, 1990 Referring Provider (PT): [place], [place] DO   (ACL surgery Dr Elsie Lincoln  03-31-19)   Encounter Date: 05/09/2019  PT End of Session - 05/09/19 1015    Visit Number  9    Number of Visits  17    Date for PT Re-Evaluation  06/09/19    Authorization Type  [employer]    PT Start Time  1005    PT Stop Time  1100    PT Time Calculation (min)  55 min    Activity Tolerance  Patient tolerated treatment well    Behavior During Therapy  [facility] for tasks assessed/performed       History reviewed. No pertinent past medical history.  History reviewed. No pertinent surgical history.  There were no vitals filed for this visit.  Subjective Assessment - 05/09/19 1013    Subjective  Pt arriving to therapy with no reports of pain. Pt reporting soreness in her R knee after going to a child's birthday party and dancing with 44 year olds.    Pertinent History  nothing remarkable    Limitations  Standing;Walking;House hold activities    How long can you sit comfortably?  unlimited    How long can you stand comfortably?  15- 20 minutes    How long can you walk comfortably?  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  No/denies         [facility] PT Assessment - 05/09/19 0001      AROM   Right Knee Extension  5    Right Knee Flexion  105      PROM   Right Knee Extension  0    Right Knee Flexion  112                   [facility] Adult PT Treatment/Exercise - 05/09/19 0001      Exercises   Exercises  Knee/Hip      Knee/Hip Exercises: Stretches   Passive Hamstring Stretch  Right;3 reps;30 seconds    Other Knee/Hip Stretches  BOSU ball dome down rocking side  to side and fowrard and back x 1 minute each    Other Knee/Hip Stretches  SLS on BOSU ball dome up with intermittent UE support      Knee/Hip Exercises: Aerobic   Recumbent Bike  L1 x 4 minutes, L2 x 2 minutes      Knee/Hip Exercises: Standing   Heel Raises  Both;20 reps   Airex   Hip Flexion Limitations  standing marches x 20   on Airex   Terminal Knee Extension  AROM;Strengthening;Right;2 sets;10 reps    Theraband Level (Terminal Knee Extension)  Level 4 (Blue)    Lateral Step Up  Right;2 sets;10 reps;Hand Hold: 2;Step Height: 4"    Forward Step Up  Right;2 sets;10 reps;Hand Hold: 1;Step Height: 6";Limitations    Forward Step Up Limitations  verbal instructions to prevent circumduction of R LE when stepping down.     Functional Squat  2 sets;10 reps    Functional Squat Limitations  TRX partial squat    Rebounder  feet apart on Airex x 30 throws red  1000 g ball    Other Standing Knee Exercises  side squats with blue theraband, band located proximal to pt's knees      Knee/Hip Exercises: Supine   Straight Leg Raises  AROM;Strengthening;Right;2 sets;10 reps      Electrical Stimulation   Electrical Stimulation Location  Right quadricep    Electrical Stimulation Action  russian    Electrical Stimulation Parameters  10/10 seconds,     Electrical Stimulation Goals  Strength;Neuromuscular facilitation             PT Education - 05/09/19 1249    Education Details  Discussed pt's progress and reviewed HEP verbally    Person(s) Educated  Patient    Methods  Explanation    Comprehension  Verbalized understanding       PT Short Term Goals - 05/04/19 1159      PT SHORT TERM GOAL #1   Title  Pt will be independent with initial HEP    Time  4    Period  Weeks    Status  On-going    Target Date  05/12/19      PT SHORT TERM GOAL #2   Title  AROM of knee extension -10 to 100 flexion to increase mobility for transitional movements    Baseline  PROM: 2- 110 degrees, AROM 5-102  degrees    Time  4    Period  Weeks    Status  Achieved    Target Date  05/12/19      PT SHORT TERM GOAL #3   Title  Demonstrate and verbalize understanding of condition management including RICE, positioning, use of A.D., HEP.    Time  4    Period  Weeks    Status  On-going    Target Date  05/12/19        PT Long Term Goals - 05/09/19 1255      PT LONG TERM GOAL #1   Title  Pt will be independent with advanced HEP.    Baseline  HEP updates ongoing    Time  8    Period  Weeks    Status  On-going      PT LONG TERM GOAL #2   Title  Pt will improve R knee extensor/flexor strength to >/= 4+/5 without excerbating  pain greater than 2/10  to promote safety with walking/standing activities    Baseline  amb with straight cane antalgic gait pattern    Time  8    Period  Weeks    Status  On-going      PT LONG TERM GOAL #3   Title  PT with be able to walk/stand >/= 1 hour with no AD with </= 2/10 pain for functional endurance and return to leisure activities post DC including walking dog    Baseline  pt progressing toward walking on level surfaces short distances with no device    Time  8    Period  Weeks      PT LONG TERM GOAL #4   Title  FOTO will improve from  68% limitation  to  38% limitation     indicating improved functional mobility .    Baseline  44% limited 05/06/19    Time  8    Period  Weeks    Status  On-going      PT LONG TERM GOAL #5   Title  Pt will improve her R knee flexion to  >/= 120 degrees and extension to </=  5 degrees with </= 2/10 pain for a more functional and efficient gait pattern    Baseline  AROM: 5-102    Time  8    Period  Weeks    Status  On-going      PT LONG TERM GOAL #6   Baseline  unable to walk without AD or carry only minimal weight    Time  8    Period  Weeks    Status  On-going            Plan - 05/09/19 1249    Clinical Impression Statement  Pt tolerating treatment well. Pt was able to complete full revolutions on the  bike with initial try. Pt progressing with PROM 0-112 degrees. Pt making progress with strengthening with only mild extensor lag noted. Pt still reporting numbness along lateral R knee. Pt did report her leg is getting easier to lift. Continue with skilled PT and progress as pt tolerates toward PLOF.    Stability/Clinical Decision Making  Stable/Uncomplicated    Rehab Potential  Good    PT Frequency  2x / week    PT Treatment/Interventions  ADLs/Self Care Home Management;Electrical Stimulation;Cryotherapy;Moist Heat;Ultrasound;Gait training;Stair training;Functional mobility training;Therapeutic activities;Therapeutic exercise;Neuromuscular re-education;Patient/family education;Scar mobilization;Passive range of motion;Manual techniques;Dry needling;Taping;Vasopneumatic Device    PT Next Visit Plan  (s/p right ACL surgery 03-31-19) continue strengthening and progress closed chain activities as tolerated, continue estim to assist quad activation, as quad control improves add further balance/proprioceotive challenges    PT Home Exercise Plan  quad set.  knee flexion on wall, heel slides with strap mini squats at counter,  wt shifting    Consulted and Agree with Plan of Care  Patient       Patient will benefit from skilled therapeutic intervention in order to improve the following deficits and impairments:     Visit Diagnosis: Acute pain of right knee  Difficulty in walking, not elsewhere classified  Muscle weakness (generalized)  Localized edema     Problem List There are no problems to display for this patient.   Sharmon Leyden, PT 05/09/2019, 12:58 PM  [facility] [address] [place], [place], [address] Phone: [phone]   Fax:  [phone]  Name: Michayla Mcneil MRN: [id number] Date of Birth: 10-13-1990

## 2019-05-11 ENCOUNTER — Ambulatory Visit: Payer: Self-pay | Admitting: Physical Therapy

## 2019-05-12 ENCOUNTER — Other Ambulatory Visit: Payer: Self-pay

## 2019-05-12 ENCOUNTER — Encounter: Payer: Self-pay | Admitting: Physical Therapy

## 2019-05-12 ENCOUNTER — Ambulatory Visit: Payer: 59 | Admitting: Physical Therapy

## 2019-05-12 DIAGNOSIS — M25561 Pain in right knee: Secondary | ICD-10-CM

## 2019-05-12 DIAGNOSIS — R600 Localized edema: Secondary | ICD-10-CM

## 2019-05-12 DIAGNOSIS — R262 Difficulty in walking, not elsewhere classified: Secondary | ICD-10-CM

## 2019-05-12 DIAGNOSIS — M6281 Muscle weakness (generalized): Secondary | ICD-10-CM

## 2019-05-12 NOTE — Therapy (Signed)
[facility] Out[facility] [address] [place], [place], [address] Phone: [phone]   Fax:  [phone]  Physical Therapy Treatment  Patient Details  Name: Kathleen Duffy MRN: [id number] Date of Birth: 1990-06-23 Referring Provider (PT): [place], [place] DO   (ACL surgery Dr Elsie Lincoln  03-31-19)   Encounter Date: 05/12/2019  PT End of Session - 05/12/19 0934    Visit Number  10    Number of Visits  17    Date for PT Re-Evaluation  06/09/19    Authorization Type  Cigna    PT Start Time  6010    PT Stop Time  1035    PT Time Calculation (min)  61 min    Activity Tolerance  Patient tolerated treatment well    Behavior During Therapy  [facility] for tasks assessed/performed       History reviewed. No pertinent past medical history.  History reviewed. No pertinent surgical history.  There were no vitals filed for this visit.  Subjective Assessment - 05/12/19 0935    Subjective  No pain pre-tx. Next MD visit is next week (appointment will be with PA). Still using cane "here and there" but arrives without AD today.    Currently in Pain?  No/denies                       [facility] Adult PT Treatment/Exercise - 05/12/19 0001      Knee/Hip Exercises: Stretches   Gastroc Stretch  Right;3 reps;30 seconds    Gastroc Stretch Limitations  standing stretch at counter      Knee/Hip Exercises: Aerobic   Recumbent Bike  L1 x 6 min   full revolutions     Knee/Hip Exercises: Machines for Strengthening   Cybex Leg Press  35 lbs. 2x10 bilat. LE    Hip Cybex  abduction 2x10 ea. bilat. 12.5 lbs.      Knee/Hip Exercises: Standing   Forward Lunges Limitations  mini front lunge at counter x 15 reps    Lateral Step Up  Right;2 sets;10 reps;Hand Hold: 2;Step Height: 4"    Forward Step Up  Right;2 sets;10 reps;Hand Hold: 1;Step Height: 6";Limitations    Functional Squat  3 sets;10 reps    Functional Squat Limitations  TRX squat    Rocker Board  2  minutes    Rocker Board Limitations  fw and lateral dynamic balance    SLS  on Airex 3 x 10 sec    Rebounder  --      Knee/Hip Exercises: Supine   Heel Slides  AROM;Right;20 reps    Bridges Limitations  2x10 with legs over bolster    Straight Leg Raises  AROM;Strengthening;Right;2 sets;10 reps      Cryotherapy   Number Minutes Cryotherapy  10 Minutes    Cryotherapy Location  Knee    Type of Cryotherapy  Ice pack      Electrical Stimulation   Electrical Stimulation Location  Right quadricep    Electrical Stimulation Action  Russian    Electrical Stimulation Parameters  10/10 sec x 10 minutes to tolerance    Electrical Stimulation Goals  Strength;Neuromuscular facilitation               PT Short Term Goals - 05/04/19 1159      PT SHORT TERM GOAL #1   Title  Pt will be independent with initial HEP    Time  4    Period  Weeks  Status  On-going    Target Date  05/12/19      PT SHORT TERM GOAL #2   Title  AROM of knee extension -10 to 100 flexion to increase mobility for transitional movements    Baseline  PROM: 2- 110 degrees, AROM 5-102 degrees    Time  4    Period  Weeks    Status  Achieved    Target Date  05/12/19      PT SHORT TERM GOAL #3   Title  Demonstrate and verbalize understanding of condition management including RICE, positioning, use of A.D., HEP.    Time  4    Period  Weeks    Status  On-going    Target Date  05/12/19        PT Long Term Goals - 05/09/19 1255      PT LONG TERM GOAL #1   Title  Pt will be independent with advanced HEP.    Baseline  HEP updates ongoing    Time  8    Period  Weeks    Status  On-going      PT LONG TERM GOAL #2   Title  Pt will improve R knee extensor/flexor strength to >/= 4+/5 without excerbating  pain greater than 2/10  to promote safety with walking/standing activities    Baseline  amb with straight cane antalgic gait pattern    Time  8    Period  Weeks    Status  On-going      PT LONG TERM GOAL #3    Title  PT with be able to walk/stand >/= 1 hour with no AD with </= 2/10 pain for functional endurance and return to leisure activities post DC including walking dog    Baseline  pt progressing toward walking on level surfaces short distances with no device    Time  8    Period  Weeks      PT LONG TERM GOAL #4   Title  FOTO will improve from  68% limitation  to  38% limitation     indicating improved functional mobility .    Baseline  44% limited 05/06/19    Time  8    Period  Weeks    Status  On-going      PT LONG TERM GOAL #5   Title  Pt will improve her R knee flexion to  >/= 120 degrees and extension to </= 5 degrees with </= 2/10 pain for a more functional and efficient gait pattern    Baseline  AROM: 5-102    Time  8    Period  Weeks    Status  On-going      PT LONG TERM GOAL #6   Baseline  unable to walk without AD or carry only minimal weight    Time  8    Period  Weeks    Status  On-going            Plan - 05/12/19 0945    Clinical Impression Statement  Still with mild quad lag limiting terminal knee extension, quad weakness evident with decreased eccentric control with step downs but showing gradual improvement with quad strength and decreased need AD for gait.    Stability/Clinical Decision Making  Stable/Uncomplicated    Clinical Decision Making  Low    Rehab Potential  Good    PT Frequency  2x / week    PT Duration  8 weeks    PT  Treatment/Interventions  ADLs/Self Care Home Management;Electrical Stimulation;Cryotherapy;Moist Heat;Ultrasound;Gait training;Stair training;Functional mobility training;Therapeutic activities;Therapeutic exercise;Neuromuscular re-education;Patient/family education;Scar mobilization;Passive range of motion;Manual techniques;Dry needling;Taping;Vasopneumatic Device    PT Next Visit Plan  (s/p right ACL surgery 03-31-19) continue strengthening and progress closed chain activities as tolerated, continue estim to assist quad activation, as  quad control improves add further balance/proprioceotive challenges    PT Home Exercise Plan  quad set.  knee flexion on wall, heel slides with strap mini squats at counter,  wt shifting    Consulted and Agree with Plan of Care  Patient       Patient will benefit from skilled therapeutic intervention in order to improve the following deficits and impairments:  Abnormal gait, Decreased activity tolerance, Decreased mobility, Decreased range of motion, Decreased strength, Increased edema, Difficulty walking, Pain  Visit Diagnosis: Acute pain of right knee  Difficulty in walking, not elsewhere classified  Muscle weakness (generalized)  Localized edema     Problem List There are no problems to display for this patient.   Lazarus Gowda, PT, DPT 05/12/19 10:15 AM  [facility] [address] [place], [place], [address] Phone: [phone]   Fax:  [phone]  Name: Kathleen Duffy MRN: [id number] Date of Birth: 06/07/1990

## 2019-05-16 ENCOUNTER — Ambulatory Visit: Payer: 59 | Admitting: Physical Therapy

## 2019-05-16 ENCOUNTER — Ambulatory Visit: Payer: 59 | Attending: Orthopaedic Surgery

## 2019-05-16 DIAGNOSIS — M25561 Pain in right knee: Secondary | ICD-10-CM | POA: Insufficient documentation

## 2019-05-16 DIAGNOSIS — R262 Difficulty in walking, not elsewhere classified: Secondary | ICD-10-CM | POA: Insufficient documentation

## 2019-05-16 DIAGNOSIS — R600 Localized edema: Secondary | ICD-10-CM | POA: Insufficient documentation

## 2019-05-16 DIAGNOSIS — M6281 Muscle weakness (generalized): Secondary | ICD-10-CM | POA: Insufficient documentation

## 2019-05-18 ENCOUNTER — Ambulatory Visit: Payer: 59 | Admitting: Physical Therapy

## 2019-05-19 ENCOUNTER — Ambulatory Visit: Payer: 59 | Admitting: Physical Therapy

## 2019-05-19 ENCOUNTER — Other Ambulatory Visit: Payer: Self-pay

## 2019-05-19 ENCOUNTER — Encounter: Payer: Self-pay | Admitting: Physical Therapy

## 2019-05-19 DIAGNOSIS — M6281 Muscle weakness (generalized): Secondary | ICD-10-CM

## 2019-05-19 DIAGNOSIS — R262 Difficulty in walking, not elsewhere classified: Secondary | ICD-10-CM

## 2019-05-19 DIAGNOSIS — M25561 Pain in right knee: Secondary | ICD-10-CM | POA: Diagnosis present

## 2019-05-19 DIAGNOSIS — R600 Localized edema: Secondary | ICD-10-CM | POA: Diagnosis present

## 2019-05-19 NOTE — Therapy (Signed)
[facility] [place], [place], [address] Phone: [phone]   Fax:  [phone]  Physical Therapy Treatment  Patient Details  Name: Kathleen Duffy MRN: [id number] Date of Birth: November 03, 1990 Referring Provider (PT): [place], [place] DO   (ACL surgery Dr Christena Flake  03-31-19)   Encounter Date: 05/19/2019  PT End of Session - 05/19/19 1508    Visit Number  11    Number of Visits  17    Date for PT Re-Evaluation  06/09/19    Authorization Type  [employer]a    PT Start Time  1508   arrived a few minutes late   PT Stop Time  1614    PT Time Calculation (min)  66 min    Activity Tolerance  Patient tolerated treatment well    Behavior During Therapy  [facility] for tasks assessed/performed       History reviewed. No pertinent past medical history.  History reviewed. No pertinent surgical history.  There were no vitals filed for this visit.  Subjective Assessment - 05/19/19 1509    Subjective  Pt. reports tried to go back to work this past Tuesday (works at [employer] center for [employer]) but reports had some difficulty with level of lifting required and has not been back since. She is interested in trying back for a partial or restricted duty-next MD follow up is 05/23/19 so recommend address work status with MD at this visit or could call MD first to request note. Pt. currently under self-pay status due to inactive insurance coverage-discussed and she is aware, trying to work on getting coverage activated.    Currently in Pain?  Yes    Pain Score  3     Pain Location  Knee    Pain Orientation  Right    Pain Descriptors / Indicators  Aching    Pain Type  Acute pain;Surgical pain    Pain Onset  More than a month ago    Pain Frequency  Intermittent    Aggravating Factors   prolonged sitting, initial movement after prolonged sitting    Pain Relieving Factors  no specific eases noted         [facility] PT Assessment - 05/19/19  0001      AROM   Right Knee Extension  -3    Right Knee Flexion  110      PROM   Right Knee Flexion  115                   [facility] Adult PT Treatment/Exercise - 05/19/19 0001      Knee/Hip Exercises: Stretches   Gastroc Stretch  Right;3 reps;30 seconds    Gastroc Stretch Limitations  standing stretch at counter      Knee/Hip Exercises: Aerobic   Recumbent Bike  L1 x 6 min      Knee/Hip Exercises: Machines for Strengthening   Cybex Leg Press  35 lbs. 2x10 bilat. LE    Hip Cybex  abduction 2x10 ea. bilat. 12.5 lbs.      Knee/Hip Exercises: Standing   Hip Abduction  AROM;Stengthening;2 sets;10 reps    Abduction Limitations  Green Theraband proximal to knees    Lateral Step Up  Right;2 sets;10 reps;Hand Hold: 2;Step Height: 4"    Lateral Step Up Limitations  eccentric lateral stepdown    Forward Step Up  Right;2 sets;10 reps;Hand Hold: 1;Step Height: 6";Limitations    Functional Squat Limitations  front squat with touch to  incline wedge on low table holding 10 lb. KB 2x10    Rocker Board  2 minutes    Rocker Board Limitations  fw and lateral dynamic balance   blue board for lateral, wooden board for fw/rev   SLS  R SLS on Green Theraband pad 3 x 10 sec     Rebounder  R SLS on green Theraband pad with left toe touch x 30 throws with 1000g ball    Other Standing Knee Exercises  side split partial squat 2x10 on right    Other Standing Knee Exercises  Monster walk with green Theraband proximal to knees 20 feet x 2      Knee/Hip Exercises: Supine   Heel Slides  AROM;Right;20 reps   with therapist overpressure at end-range flexion   Bridges Limitations  2x10 with legs over bolster    Straight Leg Raises  AROM;Strengthening;Right;2 sets;10 reps      Cryotherapy   Number Minutes Cryotherapy  10 Minutes    Cryotherapy Location  Knee    Type of Cryotherapy  Ice pack      Electrical Stimulation   Electrical Stimulation Location  Right quadricep    Electrical Stimulation  Action  Russian    Electrical Stimulation Parameters  10/10 sec x 10 minutes to tolerance    Electrical Stimulation Goals  Strength;Neuromuscular facilitation               PT Short Term Goals - 05/19/19 1601      PT SHORT TERM GOAL #1   Title  Pt will be independent with initial HEP    Baseline  met    Time  4    Period  Weeks    Status  Achieved      PT SHORT TERM GOAL #2   Title  AROM of knee extension -10 to 100 flexion to increase mobility for transitional movements    Baseline  3-110 AROM    Time  4    Period  Weeks    Status  Achieved      PT SHORT TERM GOAL #3   Title  Demonstrate and verbalize understanding of condition management including RICE, positioning, use of A.D., HEP.    Baseline  met    Time  4    Period  Weeks    Status  Achieved        PT Long Term Goals - 05/19/19 1602      PT LONG TERM GOAL #1   Title  Pt will be independent with advanced HEP.    Baseline  HEP updates ongoing    Time  8    Period  Weeks    Status  On-going      PT LONG TERM GOAL #2   Title  Pt will improve R knee extensor/flexor strength to >/= 4+/5 without excerbating  pain greater than 2/10  to promote safety with walking/standing activities    Baseline  MMT not tested yet given recent post-op status    Time  8    Period  Weeks    Status  On-going      PT LONG TERM GOAL #3   Title  PT with be able to walk/stand >/= 1 hour with no AD with </= 2/10 pain for functional endurance and return to leisure activities post DC including walking dog    Baseline  pt progressing toward walking on level surfaces short distances with no device    Time  8  Period  Weeks    Status  On-going      PT LONG TERM GOAL #4   Title  FOTO will improve from  68% limitation  to  38% limitation     indicating improved functional mobility .    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 Also continues with mild quad lag limiting terminal knee extension and most evident when fatigue. Still some stiffness limiting knee flexion ROM>extension but ROM improving from previous status. Continued PT needed for further progress ot address remaining functional limitations and assist return to full duty at work.    Personal Factors and Comorbidities  Profession    Examination-Activity Limitations  Lift;Squat;Stairs;Locomotion Level;Stand    Stability/Clinical Decision Making  Stable/Uncomplicated    Clinical Decision Making  Low    Rehab Potential  Good    PT Frequency  2x / week    PT Duration  8 weeks    PT Treatment/Interventions  ADLs/Self Care Home Management;Electrical Stimulation;Cryotherapy;Moist Heat;Ultrasound;Gait training;Stair training;Functional mobility training;Therapeutic activities;Therapeutic exercise;Neuromuscular re-education;Patient/family education;Scar mobilization;Passive range of motion;Manual techniques;Dry needling;Taping;Vasopneumatic Device    PT Next Visit Plan  (s/p right ACL surgery 03-31-19) continue strengthening and progress closed chain activities as tolerated, continue estim to assist quad activation, as quad control improves add further  balance/proprioceotive challenges    PT Home Exercise Plan  quad set.  knee flexion on wall, heel slides with strap mini squats at counter,  wt shifting    Consulted and Agree with Plan of Care  Patient       Patient will benefit from skilled therapeutic intervention in order to improve the following deficits and impairments:  Abnormal gait, Decreased activity tolerance, Decreased mobility, Decreased range of motion, Decreased strength, Increased edema, Difficulty walking, Pain  Visit Diagnosis: Acute pain of right knee  Difficulty in walking, not elsewhere classified  Muscle weakness (generalized)  Localized edema     Problem List There are no problems to display for this patient.  Beaulah Dinning, PT, DPT 05/19/19 4:09 PM  [facility] [address] [place], [place], [address] Phone: [phone]   Fax:  [phone]  Name: Kathleen Duffy MRN: [id number] Date of Birth: 1990/10/29

## 2019-05-23 ENCOUNTER — Ambulatory Visit: Payer: 59 | Admitting: Physical Therapy

## 2019-05-24 ENCOUNTER — Ambulatory Visit: Payer: 59 | Admitting: Physical Therapy

## 2019-05-25 ENCOUNTER — Telehealth: Payer: Self-pay | Admitting: Physical Therapy

## 2019-05-25 ENCOUNTER — Encounter: Payer: Managed Care, Other (non HMO) | Admitting: Physical Therapy

## 2019-05-25 ENCOUNTER — Ambulatory Visit: Payer: 59 | Admitting: Physical Therapy

## 2019-05-25 NOTE — Telephone Encounter (Signed)
Called and spoke with patient regarding no show for therapy appointment today-she reports thought appointment was for 05/26/19. Confirmed next scheduled appointment for 05/30/19.

## 2019-05-30 ENCOUNTER — Ambulatory Visit: Payer: 59 | Admitting: Physical Therapy

## 2019-05-30 ENCOUNTER — Encounter: Payer: Managed Care, Other (non HMO) | Admitting: Physical Therapy

## 2019-06-01 ENCOUNTER — Encounter: Payer: Self-pay | Admitting: Physical Therapy

## 2019-06-01 ENCOUNTER — Other Ambulatory Visit: Payer: Self-pay

## 2019-06-01 ENCOUNTER — Ambulatory Visit: Payer: 59 | Admitting: Physical Therapy

## 2019-06-01 ENCOUNTER — Encounter: Payer: Managed Care, Other (non HMO) | Admitting: Physical Therapy

## 2019-06-01 DIAGNOSIS — M6281 Muscle weakness (generalized): Secondary | ICD-10-CM

## 2019-06-01 DIAGNOSIS — R262 Difficulty in walking, not elsewhere classified: Secondary | ICD-10-CM

## 2019-06-01 DIAGNOSIS — M25561 Pain in right knee: Secondary | ICD-10-CM

## 2019-06-01 DIAGNOSIS — R600 Localized edema: Secondary | ICD-10-CM

## 2019-06-01 NOTE — Therapy (Signed)
[place], [place], [address] Phone: [phone]   Fax:  [phone]  Physical Therapy Treatment  Patient Details  Name: Kathleen Duffy MRN: [id number] Date of Birth: 27-Mar-1990 Referring Provider (PT): [place], [place] DO   (ACL surgery Dr Christena Flake  03-31-19)   Encounter Date: 06/01/2019  PT End of Session - 06/01/19 1558    Visit Number  12    Number of Visits  17    Date for PT Re-Evaluation  06/09/19    Authorization Type  [employer]    PT Start Time  8453   arrived late   PT Stop Time  1640   36 min direct tx. time   PT Time Calculation (min)  47 min    Activity Tolerance  Patient tolerated treatment well    Behavior During Therapy  [facility] for tasks assessed/performed       History reviewed. No pertinent past medical history.  History reviewed. No pertinent surgical history.  There were no vitals filed for this visit.  Subjective Assessment - 06/01/19 1555    Subjective  Pt. had MD follow up and was cleared for return to partial duty at work with 4 hour shifts. 3/10 pain this AM. Pt. reports plans to try joining a gym today to help with exercises/strengthening.    Currently in Pain?  Yes    Pain Location  Knee    Pain Orientation  Right    Pain Descriptors / Indicators  Aching    Pain Type  Acute pain;Surgical pain    Pain Onset  More than a month ago    Pain Frequency  Intermittent    Aggravating Factors   prolonged sitting    Pain Relieving Factors  no eases noted                       [facility] Adult PT Treatment/Exercise - 06/01/19 0001      Knee/Hip Exercises: Aerobic   Recumbent Bike  L3 x 5 min      Knee/Hip Exercises: Machines for Strengthening   Cybex Leg Press  35 lbs. 2x10 bilat. LE      Knee/Hip Exercises: Standing   Forward Lunges  Right;2 sets;10 reps    Forward Lunges Limitations  partial/"mini" lunge    Lateral Step Up  Right;2 sets;10 reps;Hand Hold: 2;Step  Height: 4"    Lateral Step Up Limitations  eccentric lateral stepdown    Forward Step Up  Right;2 sets;10 reps;Hand Hold: 1;Step Height: 4";Limitations    Functional Squat Limitations  TRX squat 2x10, KB front squat x 10 reps with 10 lbs. for instruction HEP variation    SLS  Right side 3 x 10 sec holds    Rebounder  R SLS with left toe touch x 20 reps      Knee/Hip Exercises: Supine   Straight Leg Raises  AROM;Strengthening;Right;2 sets;10 reps      Cryotherapy   Number Minutes Cryotherapy  10 Minutes    Cryotherapy Location  Knee    Type of Cryotherapy  Ice pack               PT Short Term Goals - 05/19/19 1601      PT SHORT TERM GOAL #1   Title  Pt will be independent with initial HEP    Baseline  met    Time  4    Period  Weeks    Status  Achieved  PT SHORT TERM GOAL #2   Title  AROM of knee extension -10 to 100 flexion to increase mobility for transitional movements    Baseline  3-110 AROM    Time  4    Period  Weeks    Status  Achieved      PT SHORT TERM GOAL #3   Title  Demonstrate and verbalize understanding of condition management including RICE, positioning, use of A.D., HEP.    Baseline  met    Time  4    Period  Weeks    Status  Achieved        PT Long Term Goals - 05/19/19 1602      PT LONG TERM GOAL #1   Title  Pt will be independent with advanced HEP.    Baseline  HEP updates ongoing    Time  8    Period  Weeks    Status  On-going      PT LONG TERM GOAL #2   Title  Pt will improve R knee extensor/flexor strength to >/= 4+/5 without excerbating  pain greater than 2/10  to promote safety with walking/standing activities    Baseline  MMT not tested yet given recent post-op status    Time  8    Period  Weeks    Status  On-going      PT LONG TERM GOAL #3   Title  PT with be able to walk/stand >/= 1 hour with no AD with </= 2/10 pain for functional endurance and return to leisure activities post DC including walking dog    Baseline  pt  progressing toward walking on level surfaces short distances with no device    Time  8    Period  Weeks    Status  On-going      PT LONG TERM GOAL #4   Title  FOTO will improve from  68% limitation  to  38% limitation     indicating improved functional mobility .    Baseline  44% limited 05/06/19    Time  8    Period  Weeks    Status  On-going      PT LONG TERM GOAL #5   Title  Pt will improve her R knee flexion to  >/= 120 degrees and extension to </= 5 degrees with </= 2/10 pain for a more functional and efficient gait pattern    Baseline  3-110 deg AROM    Time  8    Period  Weeks    Status  On-going      PT LONG TERM GOAL #6   Title  Pt will be able to carry up to 50lb in order to return to work at [employer] center for work    Baseline  attempted return 05/17/19 but had difficultyand has not returned again, work status pending MD follow up next week    Time  8    Period  Weeks    Status  On-going            Plan - 06/01/19 1647    Clinical Impression Statement  Improved quad strength with ability to perform SLR without quad lag now but still with weakness, lacks eccentric control with step down motions. Per pt. report of planning to join gym focused on HEP instruction/progressoin for gym performance with good tolerance.    Personal Factors and Comorbidities  Profession    Examination-Activity Limitations  Lift;Squat;Stairs;Locomotion Level;Stand  Stability/Clinical Decision Making  Stable/Uncomplicated    Clinical Decision Making  Low    Rehab Potential  Good    PT Frequency  2x / week    PT Duration  8 weeks    PT Treatment/Interventions  ADLs/Self Care Home Management;Electrical Stimulation;Cryotherapy;Moist Heat;Ultrasound;Gait training;Stair training;Functional mobility training;Therapeutic activities;Therapeutic exercise;Neuromuscular re-education;Patient/family education;Scar mobilization;Passive range of motion;Manual techniques;Dry  needling;Taping;Vasopneumatic Device    PT Next Visit Plan  (s/p right ACL surgery 03-31-19) continue strengthening and progress closed chain activities as tolerated, continue  balance/proprioceotive challenges    PT Home Exercise Plan  SLR, heel slide, squat, forward and lateral step ups, leg press, hip abd SLR, SLS, Rebounder toss at gym, hip bridge    Consulted and Agree with Plan of Care  Patient       Patient will benefit from skilled therapeutic intervention in order to improve the following deficits and impairments:  Abnormal gait, Decreased activity tolerance, Decreased mobility, Decreased range of motion, Decreased strength, Increased edema, Difficulty walking, Pain  Visit Diagnosis: Acute pain of right knee  Difficulty in walking, not elsewhere classified  Muscle weakness (generalized)  Localized edema     Problem List There are no problems to display for this patient.   Beaulah Dinning, PT, DPT 06/01/19 4:50 PM  [facility] [address] [place], [place], [address] Phone: [phone]   Fax:  [phone]  Name: Kathleen Duffy MRN: [id number] Date of Birth: [birth year]

## 2019-06-02 ENCOUNTER — Ambulatory Visit: Payer: 59 | Admitting: Physical Therapy

## 2019-06-02 DIAGNOSIS — M6281 Muscle weakness (generalized): Secondary | ICD-10-CM

## 2019-06-02 DIAGNOSIS — R262 Difficulty in walking, not elsewhere classified: Secondary | ICD-10-CM

## 2019-06-02 DIAGNOSIS — R600 Localized edema: Secondary | ICD-10-CM

## 2019-06-02 DIAGNOSIS — M25561 Pain in right knee: Secondary | ICD-10-CM

## 2019-06-02 NOTE — Therapy (Addendum)
[place], [place], [address] Phone: [phone]   Fax:  [phone]  Physical Therapy Treatment Discharge Summary  Patient Details  Name: Kathleen Duffy MRN: [id number] Date of Birth: March 23, 1990 Referring Provider (PT): [place], [place] DO   (ACL surgery Dr Christena Flake  03-31-19)   Encounter Date: 06/02/2019  PT End of Session - 06/02/19 0941    Visit Number  13    Number of Visits  17    Date for PT Re-Evaluation  06/09/19    Authorization Type  [employer]    PT Start Time  0847    PT Stop Time  0940    PT Time Calculation (min)  53 min    Activity Tolerance  Patient tolerated treatment well    Behavior During Therapy  [facility] for tasks assessed/performed       No past medical history on file.  No past surgical history on file.  There were no vitals filed for this visit.  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  MD gave me 4 -5 hour shifts and I am waiting on approval now for return to work    Pertinent History  nothing remarkable    Limitations  Standing;Walking;House hold activities    How long can you sit comfortably?  unlimited    How long can you stand comfortably?  20-26mnutes to wash dishes light housekeeping    How long can you walk comfortably?  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  No/denies    Pain Score  0-No pain    Pain Location  Knee    Pain Orientation  Right                       [facility] Adult PT Treatment/Exercise - 06/02/19 0853      Self-Care   Self-Care  Other Self-Care Comments    Other Self-Care Comments   scar tissue massage of  RT knee with return demo      Knee/Hip Exercises: Stretches   Gastroc Stretch  Right;3 reps;30 seconds    Gastroc  Stretch Limitations  standing on slant board for 1 rep and then 2 at counter    Soleus Stretch  Right;3 reps;30 seconds      Knee/Hip Exercises: Aerobic   Recumbent Bike  L1 x 6 min   full revolutions     Knee/Hip Exercises: Machines for Strengthening   Cybex Leg Press  45 lbs. 2x10 bilat. LE    Hip Cybex  abduction 2x10 ea. bilat. 25 lbs.      Knee/Hip Exercises: Standing   Forward Lunges  Right;2 sets;10 reps    Forward Lunges Limitations  RT forward and LT knee touching Ther ex pad with RT knee with blue t band ( external cue for hip ER to prevent knee collapse medially    Hip Abduction  AROM;Stengthening;2 sets;10 reps    Abduction Limitations  Blue Theraband proximal to knees Tempo 1 second exttend and slow 3 sec eccentric knee ext    Lateral Step Up  Right;2 sets;10 reps;Hand Hold: 2;Step Height: 4"    Lateral Step Up Limitations  eccentric lateral stepdown  Forward Step Up  Right;2 sets;10 reps;Hand Hold: 1;Step Height: 4";Limitations    Rebounder  R SLS with left toe touch x 20 reps    Other Standing Knee Exercises  single leg sit to stand on elevated surface 22inches (mat with ther ex pad, with PT mod A to come to stand,x 8  ; standiing stand to sit with eccentric control 2 x 8       Knee/Hip Exercises: Supine   Straight Leg Raises  AROM;Strengthening;Right;2 sets;10 reps      Cryotherapy   Number Minutes Cryotherapy  10 Minutes    Cryotherapy Location  Knee    Type of Cryotherapy  Ice pack             PT Education - 06/02/19 0941    Education Details  educated with demo and return demo scar tissue massage    Person(s) Educated  Patient    Methods  Explanation;Demonstration    Comprehension  Verbalized understanding;Returned demonstration       PT Short Term Goals - 05/19/19 1601      PT SHORT TERM GOAL #1   Title  Pt will be independent with initial HEP    Baseline  met    Time  4    Period  Weeks    Status  Achieved      PT SHORT TERM GOAL #2    Title  AROM of knee extension -10 to 100 flexion to increase mobility for transitional movements    Baseline  3-110 AROM    Time  4    Period  Weeks    Status  Achieved      PT SHORT TERM GOAL #3   Title  Demonstrate and verbalize understanding of condition management including RICE, positioning, use of A.D., HEP.    Baseline  met    Time  4    Period  Weeks    Status  Achieved        PT Long Term Goals - 05/19/19 1602      PT LONG TERM GOAL #1   Title  Pt will be independent with advanced HEP.    Baseline  HEP updates ongoing    Time  8    Period  Weeks    Status  On-going      PT LONG TERM GOAL #2   Title  Pt will improve R knee extensor/flexor strength to >/= 4+/5 without excerbating  pain greater than 2/10  to promote safety with walking/standing activities    Baseline  MMT not tested yet given recent post-op status    Time  8    Period  Weeks    Status  On-going      PT LONG TERM GOAL #3   Title  PT with be able to walk/stand >/= 1 hour with no AD with </= 2/10 pain for functional endurance and return to leisure activities post DC including walking dog    Baseline  pt progressing toward walking on level surfaces short distances with no device    Time  8    Period  Weeks    Status  On-going      PT LONG TERM GOAL #4   Title  FOTO will improve from  68% limitation  to  38% limitation     indicating improved functional mobility .    Baseline  44% limited 05/06/19    Time  8    Period  Weeks    Status  On-going      PT LONG TERM GOAL #5   Title  Pt will improve her R knee flexion to  >/= 120 degrees and extension to </= 5 degrees with </= 2/10 pain for a more functional and efficient gait pattern    Baseline  3-110 deg AROM    Time  8    Period  Weeks    Status  On-going      PT LONG TERM GOAL #6   Title  Pt will be able to carry up to 50lb in order to return to work at [employer] center for work    Baseline  attempted return 05/17/19 but had  difficultyand has not returned again, work status pending MD follow up next week    Time  [place] - 06/02/19 0943    Clinical Impression Statement  Pt waiting on letter from MD to work place to return at light duty and only 4-5 hour time limit.  Pt improving in quad strength and RT knee collapsing with lunge and improved with External cue using T band.  Pt working on eccentric control.  Pt did join gym but has not attended yet.  Will continue POC    Personal Factors and Comorbidities  Profession    Examination-Activity Limitations  Lift;Squat;Stairs;Locomotion Level;Stand    PT Frequency  2x / week    PT Duration  8 weeks    PT Treatment/Interventions  ADLs/Self Care Home Management;Electrical Stimulation;Cryotherapy;Moist Heat;Ultrasound;Gait training;Stair training;Functional mobility training;Therapeutic activities;Therapeutic exercise;Neuromuscular re-education;Patient/family education;Scar mobilization;Passive range of motion;Manual techniques;Dry needling;Taping;Vasopneumatic Device    PT Next Visit Plan  (s/p right ACL surgery 03-31-19) continue strengthening and progress closed chain activities as tolerated, continue  balance/proprioceotive challenges working on elevated Stand to sit for eccentric control    PT Home Exercise Plan  SLR, heel slide, squat, forward and lateral step ups, leg press, hip abd SLR, SLS, Rebounder toss at gym, hip bridge    Consulted and Agree with Plan of Care  Patient       Patient will benefit from skilled therapeutic intervention in order to improve the following deficits and impairments:  Abnormal gait, Decreased activity tolerance, Decreased mobility, Decreased range of motion, Decreased strength, Increased edema, Difficulty walking, Pain  Visit Diagnosis: Acute pain of right knee  Difficulty in walking, not elsewhere classified  Localized edema  Muscle weakness (generalized)    PHYSICAL THERAPY  DISCHARGE SUMMARY  Visits from Start of Care: 13   Current functional level related to goals / functional outcomes: See above    Remaining deficits: All LTG's still on-going   Education / Equipment: HEP Plan: Patient agrees to discharge.  Patient goals were not met. Patient is being discharged due to not returning since the last visit.  ?????       Problem List There are no problems to display for this patient.  Voncille Lo, PT Certified Exercise Expert for the Aging Adult  06/02/19 9:46 AM Phone: [phone] Fax: [place] [facility] [address] [place], [place], [address] Phone: [phone]   Fax:  [phone]  Name: Kenetra Hildenbrand MRN: [id number] Date of Birth: 1990/11/17  Kearney Hard, PT 06/20/19 11:57 AM

## 2019-06-06 ENCOUNTER — Encounter: Payer: Managed Care, Other (non HMO) | Admitting: Physical Therapy

## 2019-06-06 ENCOUNTER — Ambulatory Visit: Payer: 59 | Admitting: Physical Therapy

## 2019-06-08 ENCOUNTER — Telehealth: Payer: Self-pay | Admitting: Physical Therapy

## 2019-06-08 NOTE — Telephone Encounter (Signed)
Called patient regarding no show for 3/22 appointment. Reports got schedule mixed up and confirmed next appt.

## 2019-06-13 ENCOUNTER — Ambulatory Visit: Payer: 59 | Admitting: Physical Therapy

## 2019-06-13 ENCOUNTER — Encounter: Payer: Managed Care, Other (non HMO) | Admitting: Physical Therapy

## 2019-06-15 ENCOUNTER — Encounter: Payer: Managed Care, Other (non HMO) | Admitting: Physical Therapy

## 2019-06-15 ENCOUNTER — Telehealth: Payer: Self-pay | Admitting: Physical Therapy

## 2019-06-15 ENCOUNTER — Ambulatory Visit: Payer: 59 | Admitting: Physical Therapy

## 2019-06-15 NOTE — Telephone Encounter (Signed)
Called and left voicemail regarding no show for 3:45 therapy appointment today. Also left reminder attendance policy given multiple recent missed visits with cancellations and no shows.

## 2019-06-17 ENCOUNTER — Other Ambulatory Visit: Payer: Self-pay

## 2019-06-17 ENCOUNTER — Ambulatory Visit (HOSPITAL_COMMUNITY)
Admission: EM | Admit: 2019-06-17 | Discharge: 2019-06-17 | Disposition: A | Discharge status: Home / Self Care | Payer: 59 | Attending: Family Medicine | Admitting: Family Medicine

## 2019-06-17 ENCOUNTER — Encounter (HOSPITAL_COMMUNITY): Payer: Self-pay

## 2019-06-17 DIAGNOSIS — Z87891 Personal history of nicotine dependence: Secondary | ICD-10-CM | POA: Insufficient documentation

## 2019-06-17 DIAGNOSIS — Z20822 Contact with and (suspected) exposure to covid-19: Secondary | ICD-10-CM | POA: Insufficient documentation

## 2019-06-17 DIAGNOSIS — J0110 Acute frontal sinusitis, unspecified: Secondary | ICD-10-CM | POA: Insufficient documentation

## 2019-06-17 DIAGNOSIS — J029 Acute pharyngitis, unspecified: Secondary | ICD-10-CM | POA: Insufficient documentation

## 2019-06-17 DIAGNOSIS — R059 Cough, unspecified: Secondary | ICD-10-CM

## 2019-06-17 DIAGNOSIS — R519 Headache, unspecified: Secondary | ICD-10-CM | POA: Insufficient documentation

## 2019-06-17 DIAGNOSIS — R05 Cough: Secondary | ICD-10-CM | POA: Insufficient documentation

## 2019-06-17 MED ORDER — METHYLPREDNISOLONE SODIUM SUCC 125 MG IJ SOLR
INTRAMUSCULAR | Status: AC
  Filled 2019-06-17: qty 2

## 2019-06-17 MED ORDER — METHYLPREDNISOLONE SODIUM SUCC 125 MG IJ SOLR
60.0000 mg | Freq: Once | INTRAMUSCULAR | Status: AC
  Administered 2019-06-17: 60 mg via INTRAMUSCULAR

## 2019-06-17 MED ORDER — BENZONATATE 100 MG PO CAPS: 100.0000 mg | ORAL_CAPSULE | Freq: Three times a day (TID) | ORAL | 0 refills | Status: DC

## 2019-06-17 NOTE — Discharge Instructions (Addendum)
You have a sinus infection.  I have given you a steroid injection in the office today to help open you up so that he can breathe a little better tonight.  I sent in amoxicillin for you to take twice daily for 1 week.  You to mail with this medication, it can upset your stomach.  I have also sent in [facility] for you to take for cough twice a day as needed.  You may continue Sudafed or Mucinex as needed for congestion.  May also use nasal rinse as needed.  When you finish your medication, I would suggest starting a daily allergy medication such as Zyrtec, Claritin, Allegra.  If you are not feeling better by Monday, follow-up with this office or primary care.  Follow-up with the ER if you are having trouble swallowing, trouble breathing, other concerning symptoms.

## 2019-06-17 NOTE — ED Provider Notes (Addendum)
[facility]    CSN: [id number] Arrival date & time: 06/17/19  1711      History   Chief Complaint Chief Complaint  Patient presents with  . Sore Throat    HPI Kathleen Duffy is a 29 y.o. female.   Patient reports that she has been having a sore throat, cough, nasal congestion, headaches for the last 2 weeks.  She reports that she has tried Mucinex and Zyrtec to relieve her symptoms with no permanent relief.  She reports that she feels a little bit better temporarily.  She endorses that she has seasonal allergies, but does not take anything daily to treat these.  She denies shortness of breath, body aches, chills, nausea, vomiting, diarrhea, rash, fever, other symptoms.  Per chart review, patient has no significant medical history.  ROS per HPI  The history is provided by the patient.    History reviewed. No pertinent past medical history.  There are no problems to display for this patient.   History reviewed. No pertinent surgical history.  OB History   No obstetric history on file.      Home Medications    Prior to Admission medications   Medication Sig Start Date End Date Taking? Authorizing Provider  amoxicillin-clavulanate (AUGMENTIN) 875-125 MG tablet amoxicillin 875 mg-potassium clavulanate 125 mg tablet  TAKE 1 TABLET BY MOUTH TWO (2) TIMES A DAY. FOR 10 DAYS    [provider]  benzonatate (TESSALON) 100 MG capsule Take 1 capsule (100 mg total) by mouth every 8 (eight) hours. 06/17/19   Moshe Cipro, NP  HYDROcodone-acetaminophen (NORCO/VICODIN) 5-325 MG tablet Take 1-2 tablets by mouth every 6 (six) hours as needed. 03/31/19   [provider]  ibuprofen (ADVIL) 600 MG tablet ibuprofen 600 mg tablet  TAKE 1 TABLET (600 MG TOTAL) BY MOUTH EVERY SIX (6) HOURS AS NEEDED FOR PAIN.    [provider]  naproxen (NAPROSYN) 500 MG tablet Take 1 tablet (500 mg total) by mouth 2 (two) times daily with a meal. Patient not taking:  Reported on 04/14/2019 02/02/19   Judge Stall, DO    Family History Family History  Problem Relation Age of Onset  . Healthy Mother   . Healthy Father     Social History Social History   Tobacco Use  . Smoking status: Former Smoker    Types: Cigars  . Smokeless tobacco: Never Used  Substance Use Topics  . Alcohol use: Yes  . Drug use: No     Allergies   Patient has no known allergies.   Review of Systems Review of Systems   Physical Exam Triage Vital Signs ED Triage Vitals  Enc Vitals Group     BP      Pulse      Resp      Temp      Temp src      SpO2      Weight      Height      Head Circumference      Peak Flow      Pain Score      Pain Loc      Pain Edu?      Excl. in GC?    No data found.  Updated Vital Signs BP 120/87 (BP Location: Right Arm)   Pulse 92   Temp 98.8 F (37.1 C) (Oral)   Resp 18   Wt 203 lb 6.4 oz (92.3 kg)   LMP 06/06/2019   SpO2 99%  BMI 34.91 kg/m   Visual Acuity Right Eye Distance:   Left Eye Distance:   Bilateral Distance:    Right Eye Near:   Left Eye Near:    Bilateral Near:     Physical Exam Vitals and nursing note reviewed.  Constitutional:      General: She is not in acute distress.    Appearance: She is well-developed. She is ill-appearing.  HENT:     Head: Normocephalic and atraumatic.     Right Ear: Tympanic membrane normal.     Left Ear: Tympanic membrane normal.     Nose: Congestion and rhinorrhea present.     Mouth/Throat:     Mouth: Mucous membranes are moist. No oral lesions.     Pharynx: Oropharynx is clear. Posterior oropharyngeal erythema present. No pharyngeal swelling, oropharyngeal exudate or uvula swelling.  Eyes:     Conjunctiva/sclera: Conjunctivae normal.  Neck:     Comments: Bilateral cervical adenopathy. Cardiovascular:     Rate and Rhythm: Normal rate and regular rhythm.     Heart sounds: Normal heart sounds. No murmur.  Pulmonary:     Effort: Pulmonary effort is  normal. No respiratory distress.     Breath sounds: Normal breath sounds. No stridor. No wheezing, rhonchi or rales.  Chest:     Chest wall: No tenderness.  Abdominal:     General: Bowel sounds are normal. There is no distension.     Palpations: Abdomen is soft. There is no mass.     Tenderness: There is no abdominal tenderness. There is no guarding or rebound.     Hernia: No hernia is present.  Musculoskeletal:     Cervical back: Normal range of motion and neck supple.  Lymphadenopathy:     Cervical: Cervical adenopathy present.  Skin:    General: Skin is warm and dry.     Capillary Refill: Capillary refill takes less than 2 seconds.  Neurological:     General: No focal deficit present.     Mental Status: She is alert and oriented to person, place, and time.  Psychiatric:        Mood and Affect: Mood normal.        Behavior: Behavior normal.      UC Treatments / Results  Labs (all labs ordered are listed, but only abnormal results are displayed) Labs Reviewed  SARS CORONAVIRUS 2 (TAT 6-24 HRS)    EKG   Radiology No results found.  Procedures Procedures (including critical care time)  Medications Ordered in UC Medications  methylPREDNISolone sodium succinate (SOLU-MEDROL) 125 mg/2 mL injection 60 mg (60 mg Intramuscular Given 06/17/19 1811)    Initial Impression / Assessment and Plan / UC Course  I have reviewed the triage vital signs and the nursing notes.  Pertinent labs & imaging results that were available during my care of the patient were reviewed by me and considered in my medical decision making (see chart for details).     Presents today with acute sinusitis, cough, pharyngitis likely due to postnasal drip.  Has tried over-the-counter remedies with no success.  Has sinus tenderness over frontal sinuses but not maxillary.  Of note there is also some fluid behind TMs bilaterally, but no infection concerns today.  Patient received 60 mg Solu-Medrol IM in  office today.  Prescribed amoxicillin 875 twice daily x7 days to treat sinusitis.  Also sent in [facility] to help with cough as needed.  Covid swab obtained today.  Patient is instructed to quarantine  until results are back and negative.  Patient is instructed that her results will show up on MyChart, and she can check there.  Usually she will receive a phone call if her results are positive.  If results are positive, she is to stay out of work or school 10 days from today.  If she is negative, she can go back to work or school as she is feeling able.  Patient is instructed that she has been not feeling better by Monday she can follow-up with our office or be seen with primary care.  Reasons to go to the ER include trouble swallowing, trouble breathing, other concerning symptoms. Final Clinical Impressions(s) / UC Diagnoses   Final diagnoses:  Acute non-recurrent frontal sinusitis  Pharyngitis, unspecified etiology  Cough     Discharge Instructions     You have a sinus infection.  I have given you a steroid injection in the office today to help open you up so that he can breathe a little better tonight.  I sent in amoxicillin for you to take twice daily for 1 week.  You to mail with this medication, it can upset your stomach.  I have also sent in [facility] for you to take for cough twice a day as needed.  You may continue Sudafed or Mucinex as needed for congestion.  May also use nasal rinse as needed.  When you finish your medication, I would suggest starting a daily allergy medication such as Zyrtec, Claritin, Allegra.  If you are not feeling better by Monday, follow-up with this office or primary care.  Follow-up with the ER if you are having trouble swallowing, trouble breathing, other concerning symptoms.    ED Prescriptions    Medication Sig Dispense Auth. Provider   benzonatate (TESSALON) 100 MG capsule Take 1 capsule (100 mg total) by mouth every 8 (eight) hours. 21  capsule Faustino Congress, NP     PDMP not reviewed this encounter.   Faustino Congress, NP 06/18/19 1324    Faustino Congress, NP 06/18/19 [phone]

## 2019-06-17 NOTE — ED Triage Notes (Addendum)
Pt is here with nasal congestion, sore throat, cough that started Tuesday. Pt has taken Municex, Zrytec to relieve discomfort.

## 2019-06-18 LAB — SARS CORONAVIRUS 2 (TAT 6-24 HRS): SARS Coronavirus 2: NEGATIVE

## 2019-06-30 ENCOUNTER — Ambulatory Visit: Payer: Self-pay | Attending: Orthopaedic Surgery | Admitting: Physical Therapy

## 2019-06-30 ENCOUNTER — Encounter: Payer: Self-pay | Admitting: Physical Therapy

## 2019-06-30 ENCOUNTER — Other Ambulatory Visit: Payer: Self-pay

## 2019-06-30 DIAGNOSIS — R600 Localized edema: Secondary | ICD-10-CM | POA: Insufficient documentation

## 2019-06-30 DIAGNOSIS — R262 Difficulty in walking, not elsewhere classified: Secondary | ICD-10-CM | POA: Insufficient documentation

## 2019-06-30 DIAGNOSIS — M6281 Muscle weakness (generalized): Secondary | ICD-10-CM | POA: Insufficient documentation

## 2019-06-30 DIAGNOSIS — M25561 Pain in right knee: Secondary | ICD-10-CM | POA: Insufficient documentation

## 2019-06-30 NOTE — Therapy (Signed)
[place], [place], [address] Phone: [phone]   Fax:  [phone]  Physical Therapy Treatment/Recertification  Patient Details  Name: Kathleen Duffy MRN: [id number] Date of Birth: 13-Apr-1990 Referring Provider (PT): Melrose Nakayama, MD   Encounter Date: 06/30/2019  PT End of Session - 06/30/19 1522    Visit Number  14    Number of Visits  29    Date for PT Re-Evaluation  08/25/19    Authorization Type  [employer]    PT Start Time  1510   arrived late   PT Stop Time  1551    PT Time Calculation (min)  41 min    Activity Tolerance  Patient tolerated treatment well    Behavior During Therapy  [facility] for tasks assessed/performed       History reviewed. No pertinent past medical history.  History reviewed. No pertinent surgical history.  There were no vitals filed for this visit.  Subjective Assessment - 06/30/19 1606    Subjective  Pt. returns, not seen since 06/02/19 with some cancellations and missed therapy time including having to get Covid test (came back negative) earlier this month due to some upper respiratory symptoms. She reports saw Dr. Rhona Raider for follow up yesterday and was advised to continue therapy. She had knee aspiration and injection due to some ongoing swelling and soreness issues. She continues on light duty at work with 4 hour shifts.    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  No/denies         [facility] PT Assessment - 06/30/19 0001      Assessment   Medical Diagnosis  s/p right ACL surgery    Referring Provider (PT)  Melrose Nakayama, MD    Onset Date/Surgical Date  03/31/19      Observation/Other Assessments   Focus on Therapeutic Outcomes (FOTO)   78% limited      AROM   Right Knee Extension  -5    Right Knee Flexion  110      Strength   Strength Assessment Site  Hip;Knee    Right/Left Hip  Right;Left    Right Hip Flexion  5/5    Right Hip Extension  4/5    Right Hip External Rotation   4+/5    Right Hip Internal Rotation  5/5    Right Hip ABduction  4/5    Left Hip Flexion  5/5    Left Hip Extension  4+/5    Left Hip External Rotation  5/5    Left Hip Internal Rotation  5/5    Left Hip ABduction  4+/5    Right/Left Knee  Right;Left    Right Knee Flexion  5/5    Right Knee Extension  4/5   tested at 60 deg knee flexion   Left Knee Flexion  5/5    Left Knee Extension  5/5                   [facility] Adult PT Treatment/Exercise - 06/30/19 0001      Knee/Hip Exercises: Stretches   Other Knee/Hip Stretches  HEP review heel slide vs. wall slide for knee flexion and seated vs. supine heel prop for knee extension      Knee/Hip Exercises: Aerobic   Elliptical  x 6 min L1      Knee/Hip Exercises: Machines for  Strengthening   Cybex Leg Press  55 lbs. bilat. LE 3x10      Knee/Hip Exercises: Standing   Lateral Step Up  Right;2 sets;10 reps;Hand Hold: 1;Step Height: 6"    Lateral Step Up Limitations  eccentric lateral stepdown    Forward Step Up  Right;2 sets;10 reps;Step Height: 8"    Forward Step Up Limitations  holding 7 lb. DB ea. UE    Functional Squat Limitations  KB front squat to incline wedge on low table 2x10 with 10 lbs.    Rocker Board  2 minutes    SLS  5 x 10 sec on Airex right side    Rebounder  R SLS with left toe touch x 30 reps      Knee/Hip Exercises: Supine   Bridges  AROM;Strengthening;Both;20 reps             PT Education - 06/30/19 1602    Education Details  POC, HEP, exercises, tissue healing timeframe    Person(s) Educated  Patient    Methods  Explanation;Demonstration;Verbal cues    Comprehension  Verbalized understanding;Returned demonstration       PT Short Term Goals - 06/30/19 1617      PT SHORT TERM GOAL #1   Title  Pt will be independent with initial HEP    Baseline  met    Time  4    Period  Weeks    Status  Achieved       PT SHORT TERM GOAL #2   Title  AROM of knee extension -10 to 100 flexion to increase mobility for transitional movements    Baseline  5-110    Time  4    Period  Weeks    Status  Achieved      PT SHORT TERM GOAL #3   Title  Demonstrate and verbalize understanding of condition management including RICE, positioning, use of A.D., HEP.    Baseline  met    Time  4    Period  Weeks    Status  Achieved        PT Long Term Goals - 06/30/19 1617      PT LONG TERM GOAL #1   Title  Pt will be independent with advanced HEP.    Baseline  HEP updates ongoing    Time  8    Period  Weeks    Status  On-going    Target Date  08/25/19      PT LONG TERM GOAL #2   Title  Pt will improve R knee extensor/flexor strength to >/= 4+/5 without excerbating  pain greater than 2/10  to promote safety with walking/standing activities    Baseline  knee extension 4/5    Time  8    Period  Weeks    Status  On-going    Target Date  08/25/19      PT LONG TERM GOAL #3   Title  PT with be able to walk/stand >/= 1 hour with no AD with </= 2/10 pain for functional endurance and return to leisure activities post DC including walking dog    Baseline  progressing but still with some issues with intermittent swelling and soreness    Time  8    Period  Weeks    Status  On-going    Target Date  08/25/19      PT LONG TERM GOAL #4   Title  FOTO will improve from  68% limitation  to  38% limitation     indicating improved functional mobility .    Baseline  78% limited today, continue goal    Time  8    Period  Weeks    Status  On-going    Target Date  08/25/19      PT LONG TERM GOAL #5   Title  Pt will improve her R knee flexion to  >/= 120 degrees and extension to </= 5 degrees with </= 2/10 pain for a more functional and efficient gait pattern    Baseline  5-110    Time  8    Period  Weeks    Status  Partially Met    Target Date  08/25/19            Plan - 06/30/19 1610    Clinical Impression  Statement  Pt. returns after MD visit with instructions to continue therapy now >3 months out s/p right ACL surgery and with recent absence from therapy. She continues with quad and hip weakness limiting ability for activities such as stair navigation and squatting + lifting motions/activities needed for work. She continues to demo quad weakness with lack of eccentric control for stepdown motions and also continues with knee stiffness so urged continued ROM with HEP. Significant increase noted in FOTO score for %limitation per pt. subjective report. Pt. would benefit from resumption/continuation therapy for further progress to work on strengthening and addressing knee ROM and proprioceptive deficits to improve functional status for mobility and assist return to full duty at work. Trial elliptical today which pt. was able to tolerate but recommend hold off on running until further quad strength/control is demonstrated.    Personal Factors and Comorbidities  Profession    Examination-Activity Limitations  Lift;Squat;Stairs;Locomotion Level;Stand    Stability/Clinical Decision Making  Evolving/Moderate complexity    Clinical Decision Making  Low    Rehab Potential  Good    PT Frequency  2x / week    PT Duration  8 weeks    PT Treatment/Interventions  ADLs/Self Care Home Management;Electrical Stimulation;Cryotherapy;Moist Heat;Ultrasound;Gait training;Stair training;Functional mobility training;Therapeutic activities;Therapeutic exercise;Neuromuscular re-education;Patient/family education;Scar mobilization;Passive range of motion;Manual techniques;Dry needling;Taping;Vasopneumatic Device    PT Next Visit Plan  s/p right ACL surgery 03-31-19-progress quad/hip strengthening with closed chain activities, for now continue bike vs. elliiptical, continue balance/proprioceptive challenges, knee ROM as needed    PT Home Exercise Plan  SLR, heel slide, squat, forward and lateral step ups, leg press, hip abd SLR, SLS,  Rebounder toss at gym, hip bridge    Consulted and Agree with Plan of Care  Patient       Patient will benefit from skilled therapeutic intervention in order to improve the following deficits and impairments:  Abnormal gait, Decreased activity tolerance, Decreased mobility, Decreased range of motion, Decreased strength, Increased edema, Difficulty walking, Pain  Visit Diagnosis: Acute pain of right knee  Difficulty in walking, not elsewhere classified  Localized edema  Muscle weakness (generalized)     Problem List There are no problems to display for this patient.   Beaulah Dinning, PT, DPT 06/30/19 4:23 PM  [facility] [address] [place], [place], [address] Phone: [phone]   Fax:  [phone]  Name: Darnesha Diloreto MRN: [id number] Date of Birth: Aug 26, 1990

## 2019-07-14 ENCOUNTER — Ambulatory Visit: Payer: Self-pay | Admitting: Physical Therapy

## 2019-07-18 ENCOUNTER — Encounter: Payer: Self-pay | Admitting: Physical Therapy

## 2019-07-18 ENCOUNTER — Ambulatory Visit: Payer: 59 | Attending: Orthopaedic Surgery | Admitting: Physical Therapy

## 2019-07-18 ENCOUNTER — Other Ambulatory Visit: Payer: Self-pay

## 2019-07-18 DIAGNOSIS — R600 Localized edema: Secondary | ICD-10-CM | POA: Insufficient documentation

## 2019-07-18 DIAGNOSIS — R262 Difficulty in walking, not elsewhere classified: Secondary | ICD-10-CM | POA: Diagnosis present

## 2019-07-18 DIAGNOSIS — M25561 Pain in right knee: Secondary | ICD-10-CM | POA: Insufficient documentation

## 2019-07-18 DIAGNOSIS — M6281 Muscle weakness (generalized): Secondary | ICD-10-CM | POA: Insufficient documentation

## 2019-07-18 NOTE — Therapy (Signed)
[place], [place], [address] Phone: [phone]   Fax:  [phone]  Physical Therapy Treatment  Patient Details  Name: Kathleen Duffy MRN: [id number] Date of Birth: September 01, 1990 Referring Provider (PT): Melrose Nakayama, MD   Encounter Date: 07/18/2019  PT End of Session - 07/18/19 1528    Visit Number  15    Number of Visits  29    Date for PT Re-Evaluation  08/25/19    Authorization Type  [employer]    PT Start Time  3888   Patient 5 minutes late   PT Stop Time  1500    PT Time Calculation (min)  40 min    Activity Tolerance  Patient tolerated treatment well    Behavior During Therapy  [facility] for tasks assessed/performed       History reviewed. No pertinent past medical history.  History reviewed. No pertinent surgical history.  There were no vitals filed for this visit.  Subjective Assessment - 07/18/19 1421    Subjective  Patient reports her knee is a little sore today. She has been working and standing on it. She also has just moved and is living in a house with stairs.    Pertinent History  nothing remarkable    Limitations  Standing;Walking;House hold activities    How long can you sit comfortably?  unlimited    How long can you stand comfortably?  20-100mnutes to wash dishes light housekeeping    How long can you walk comfortably?  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  Yes    Pain Score  5     Pain Location  Knee    Pain Orientation  Right    Pain Descriptors / Indicators  Aching    Pain Type  Surgical pain    Pain Onset  More than a month ago    Pain Frequency  Intermittent    Aggravating Factors   prolinged sitting    Pain Relieving Factors  no    Effect of Pain on Daily Activities  rest and ice                       [facility] Adult PT  Treatment/Exercise - 07/18/19 0001      Knee/Hip Exercises: Stretches   Passive Hamstring Stretch  Right;3 reps;30 seconds    Passive Hamstring Stretch Limitations  reviewed for HEP       Knee/Hip Exercises: Aerobic   Nustep  5 min L3       Knee/Hip Exercises: Standing   Lateral Step Up  Right;2 sets;10 reps;Hand Hold: 1;Step Height: 6"    Lateral Step Up Limitations  eccentric lateral stepdown    Forward Step Up  Right;2 sets;10 reps;Step Height: 8"    Functional Squat Limitations  KB front squat to incline wedge on low table 2x10 with 10 lbs.    Rocker Board  2 minutes    SLS  5 x 10 sec on Airex right side    Rebounder  R SLS with left toe touch x 30 reps    Other Standing Knee Exercises  lateral band walk x10 each way       Knee/Hip Exercises: Supine   Bridges  AROM;Strengthening;Both;20 reps  Straight Leg Raises  AROM;Strengthening;Right;2 sets;10 reps             PT Education - 07/18/19 1423    Education Details  reviewed technique with HEP    Person(s) Educated  Patient    Methods  Explanation;Demonstration;Tactile cues;Verbal cues    Comprehension  Verbalized understanding;Returned demonstration;Verbal cues required;Tactile cues required       PT Short Term Goals - 06/30/19 1617      PT SHORT TERM GOAL #1   Title  Pt will be independent with initial HEP    Baseline  met    Time  4    Period  Weeks    Status  Achieved      PT SHORT TERM GOAL #2   Title  AROM of knee extension -10 to 100 flexion to increase mobility for transitional movements    Baseline  5-110    Time  4    Period  Weeks    Status  Achieved      PT SHORT TERM GOAL #3   Title  Demonstrate and verbalize understanding of condition management including RICE, positioning, use of A.D., HEP.    Baseline  met    Time  4    Period  Weeks    Status  Achieved        PT Long Term Goals - 06/30/19 1617      PT LONG TERM GOAL #1   Title  Pt will be independent with advanced HEP.     Baseline  HEP updates ongoing    Time  8    Period  Weeks    Status  On-going    Target Date  08/25/19      PT LONG TERM GOAL #2   Title  Pt will improve R knee extensor/flexor strength to >/= 4+/5 without excerbating  pain greater than 2/10  to promote safety with walking/standing activities    Baseline  knee extension 4/5    Time  8    Period  Weeks    Status  On-going    Target Date  08/25/19      PT LONG TERM GOAL #3   Title  PT with be able to walk/stand >/= 1 hour with no AD with </= 2/10 pain for functional endurance and return to leisure activities post DC including walking dog    Baseline  progressing but still with some issues with intermittent swelling and soreness    Time  8    Period  Weeks    Status  On-going    Target Date  08/25/19      PT LONG TERM GOAL #4   Title  FOTO will improve from  68% limitation  to  38% limitation     indicating improved functional mobility .    Baseline  78% limited today, continue goal    Time  8    Period  Weeks    Status  On-going    Target Date  08/25/19      PT LONG TERM GOAL #5   Title  Pt will improve her R knee flexion to  >/= 120 degrees and extension to </= 5 degrees with </= 2/10 pain for a more functional and efficient gait pattern    Baseline  5-110    Time  8    Period  Weeks    Status  Partially Met    Target Date  08/25/19  Plan - 07/18/19 1529    Clinical Impression Statement  Patient asked which exercises she could do quickly before she goest to work in the [employer]. Therapy gave her 4 things she can work on before work and also advised her she should be trying to do some of the standing exercises when she gets home as well. She continues to have an extensor lag with SLR and can not maintain single leg stance for more then 5 sec without support. Therapy will continue to progress as tolerated.    Examination-Activity Limitations  Lift;Squat;Stairs;Locomotion Level;Stand    Stability/Clinical Decision  Making  Evolving/Moderate complexity    Clinical Decision Making  Low    Rehab Potential  Good    PT Frequency  2x / week    PT Duration  8 weeks    PT Treatment/Interventions  ADLs/Self Care Home Management;Electrical Stimulation;Cryotherapy;Moist Heat;Ultrasound;Gait training;Stair training;Functional mobility training;Therapeutic activities;Therapeutic exercise;Neuromuscular re-education;Patient/family education;Scar mobilization;Passive range of motion;Manual techniques;Dry needling;Taping;Vasopneumatic Device    PT Next Visit Plan  s/p right ACL surgery 03-31-19-progress quad/hip strengthening with closed chain activities, for now continue bike vs. elliiptical, continue balance/proprioceptive challenges, knee ROM as needed    PT Home Exercise Plan  SLR, heel slide, squat, forward and lateral step ups, leg press, hip abd SLR, SLS, Rebounder toss at gym, hip bridge    Consulted and Agree with Plan of Care  Patient       Patient will benefit from skilled therapeutic intervention in order to improve the following deficits and impairments:  Abnormal gait, Decreased activity tolerance, Decreased mobility, Decreased range of motion, Decreased strength, Increased edema, Difficulty walking, Pain  Visit Diagnosis: Acute pain of right knee  Difficulty in walking, not elsewhere classified  Localized edema  Muscle weakness (generalized)     Problem List There are no problems to display for this patient.   Carney Living  PT DPT  07/18/2019, 3:39 PM  [facility] [address] [place], [place], [address] Phone: [phone]   Fax:  [phone]  Name: Kathleen Duffy MRN: [id number] Date of Birth: 1990-10-02

## 2019-07-20 ENCOUNTER — Other Ambulatory Visit: Payer: Self-pay

## 2019-07-20 ENCOUNTER — Ambulatory Visit: Payer: 59 | Admitting: Physical Therapy

## 2019-07-20 ENCOUNTER — Encounter: Payer: Self-pay | Admitting: Physical Therapy

## 2019-07-20 DIAGNOSIS — R262 Difficulty in walking, not elsewhere classified: Secondary | ICD-10-CM

## 2019-07-20 DIAGNOSIS — M6281 Muscle weakness (generalized): Secondary | ICD-10-CM

## 2019-07-20 DIAGNOSIS — M25561 Pain in right knee: Secondary | ICD-10-CM | POA: Diagnosis not present

## 2019-07-20 DIAGNOSIS — R600 Localized edema: Secondary | ICD-10-CM

## 2019-07-20 NOTE — Therapy (Signed)
[place], [place], [address] Phone: [phone]   Fax:  [phone]  Physical Therapy Treatment  Patient Details  Name: Kathleen Duffy MRN: [id number] Date of Birth: 1991/01/01 Referring Provider (PT): Melrose Nakayama, MD   Encounter Date: 07/20/2019  PT End of Session - 07/20/19 1334    Visit Number  16    Number of Visits  29    Date for PT Re-Evaluation  08/25/19    Authorization Type  [employer]    PT Start Time  1332    PT Stop Time  1416    PT Time Calculation (min)  44 min    Activity Tolerance  Patient tolerated treatment well    Behavior During Therapy  [facility] for tasks assessed/performed       History reviewed. No pertinent past medical history.  History reviewed. No pertinent surgical history.  There were no vitals filed for this visit.  Subjective Assessment - 07/20/19 1335    Subjective  Pt. continues on partial duty at work with 4 hour shifts. Still noting some knee discomfort with sit>stand after sitting. She has been wearing knee compression sleeve brace mostly at work as recommended by MD with mild benefit/"more mobility" noted.         [facility] PT Assessment - 07/20/19 0001      AROM   Right Knee Extension  -4    Right Knee Flexion  121                   [facility] Adult PT Treatment/Exercise - 07/20/19 0001      Knee/Hip Exercises: Stretches   Other Knee/Hip Stretches  slant board stretch 20 sec x 3      Knee/Hip Exercises: Aerobic   Elliptical  Ramp 3 L1 x 5 min      Knee/Hip Exercises: Machines for Strengthening   Cybex Leg Press  60 lbs. 3x10 bilat. LE    Hip Cybex  hip abd 2x10 ea. bilat. 25 lbs.      Knee/Hip Exercises: Standing   Lateral Step Up  Right;2 sets;10 reps;Hand Hold: 1;Step Height: 4";Step Height: 6"    Lateral Step Up Limitations  eccentric lateral stepdown, 6 in. step 2x10, 4 in. step 1x10 for improved eccentic control    Forward Step Up  Right;2  sets;10 reps    Forward Step Up Limitations  blue side BOSU    Step Down  Right;1 set;15 reps;Hand Hold: 1;Step Height: 2"    Rocker Board  2 minutes    SLS  5 x 10 sec on Airex right side    SLS with Vectors  right foot on blue Theraband pad with 3-way vector steps LLE x 15    Rebounder  R SLS on blue Theraband pad with left toe touch x 20 throws 1000g ball    Other Standing Knee Exercises  Monster walk blue Theraband proximal to knees 20 feet x 3               PT Short Term Goals - 06/30/19 1617      PT SHORT TERM GOAL #1   Title  Pt will be independent with initial HEP    Baseline  met    Time  4    Period  Weeks    Status  Achieved      PT SHORT TERM GOAL #2   Title  AROM of knee extension -10 to 100 flexion to increase mobility for  transitional movements    Baseline  5-110    Time  4    Period  Weeks    Status  Achieved      PT SHORT TERM GOAL #3   Title  Demonstrate and verbalize understanding of condition management including RICE, positioning, use of A.D., HEP.    Baseline  met    Time  4    Period  Weeks    Status  Achieved        PT Long Term Goals - 07/20/19 1417      PT LONG TERM GOAL #1   Title  Pt will be independent with advanced HEP.    Baseline  HEP updates ongoing    Time  8    Period  Weeks    Status  On-going      PT LONG TERM GOAL #2   Title  Pt will improve R knee extensor/flexor strength to >/= 4+/5 without excerbating  pain greater than 2/10  to promote safety with walking/standing activities    Baseline  knee extension 4/5    Time  8    Period  Weeks    Status  On-going      PT LONG TERM GOAL #3   Title  PT with be able to walk/stand >/= 1 hour with no AD with </= 2/10 pain for functional endurance and return to leisure activities post DC including walking dog    Baseline  progressing but still with some issues with intermittent swelling and soreness    Time  8    Period  Weeks    Status  On-going      PT LONG TERM GOAL #4    Title  FOTO will improve from  68% limitation  to  38% limitation     indicating improved functional mobility .    Baseline  78% limited, continue goal    Time  8    Period  Weeks    Status  On-going      PT LONG TERM GOAL #5   Title  Pt will improve her R knee flexion to  >/= 120 degrees and extension to </= 5 degrees with </= 2/10 pain for a more functional and efficient gait pattern    Baseline  -4 deg extension from neutral to 121 deg flexion, pain intermittently higher but ROM goal met    Time  8    Period  Weeks    Status  Partially Met      PT LONG TERM GOAL #6   Title  Pt will be able to carry up to 50lb in order to return to work at [employer] center for work    Baseline  returned to partial duty with 4 hour shifts-lifting goal ongoing    Time  8    Period  Weeks    Status  On-going            Plan - 07/20/19 1408    Clinical Impression Statement  Continued exercise progression with emphasis strengthening for quad and hips along with balance/proprioceptive challenges. As noted last session still some quad lag with SLR and for closed chain activities still with difficulty for eccentric control during step down motions. Continued PT needed for further progress to address strength and balance limitations to improve ability for IADLs, stair navigation, and lifting for work    Personal Factors and Comorbidities  Profession    Examination-Activity Limitations  Lift;Squat;Stairs;Locomotion Level;Stand  Stability/Clinical Decision Making  Evolving/Moderate complexity    Clinical Decision Making  Low    Rehab Potential  Good    PT Frequency  2x / week    PT Duration  8 weeks    PT Treatment/Interventions  ADLs/Self Care Home Management;Electrical Stimulation;Cryotherapy;Moist Heat;Ultrasound;Gait training;Stair training;Functional mobility training;Therapeutic activities;Therapeutic exercise;Neuromuscular re-education;Patient/family education;Scar  mobilization;Passive range of motion;Manual techniques;Dry needling;Taping;Vasopneumatic Device    PT Next Visit Plan  s/p right ACL surgery 03-31-19-progress quad/hip strengthening with closed chain activities, for now continue bike vs. elliiptical, continue balance/proprioceptive challenges, knee ROM as needed    PT Home Exercise Plan  SLR, heel slide, squat, forward and lateral step ups, leg press, hip abd SLR, SLS, Rebounder toss at gym, hip bridge    Consulted and Agree with Plan of Care  Patient       Patient will benefit from skilled therapeutic intervention in order to improve the following deficits and impairments:  Abnormal gait, Decreased activity tolerance, Decreased mobility, Decreased range of motion, Decreased strength, Increased edema, Difficulty walking, Pain  Visit Diagnosis: Acute pain of right knee  Difficulty in walking, not elsewhere classified  Localized edema  Muscle weakness (generalized)     Problem List There are no problems to display for this patient.   Beaulah Dinning, PT, DPT 07/20/19 2:20 PM  [facility] [address] [place], [place], [address] Phone: [phone]   Fax:  [phone]  Name: Kathleen Duffy MRN: [id number] Date of Birth: Apr 25, 1990

## 2019-07-25 ENCOUNTER — Telehealth: Payer: Self-pay | Admitting: Physical Therapy

## 2019-07-25 ENCOUNTER — Ambulatory Visit: Payer: 59 | Admitting: Physical Therapy

## 2019-07-25 NOTE — Telephone Encounter (Signed)
Attempted to call patient regarding no show for therapy appointment today-left voicemail including reminder next appointment time and request to contact clinic if needing to reschedule.

## 2019-07-27 ENCOUNTER — Encounter: Payer: Self-pay | Admitting: Physical Therapy

## 2019-07-27 ENCOUNTER — Ambulatory Visit (HOSPITAL_COMMUNITY)
Admission: EM | Admit: 2019-07-27 | Discharge: 2019-07-27 | Disposition: A | Discharge status: Home / Self Care | Payer: 59 | Attending: Emergency Medicine | Admitting: Emergency Medicine

## 2019-07-27 ENCOUNTER — Ambulatory Visit: Payer: 59 | Admitting: Physical Therapy

## 2019-07-27 ENCOUNTER — Encounter (HOSPITAL_COMMUNITY): Payer: Self-pay

## 2019-07-27 ENCOUNTER — Other Ambulatory Visit: Payer: Self-pay

## 2019-07-27 DIAGNOSIS — R600 Localized edema: Secondary | ICD-10-CM

## 2019-07-27 DIAGNOSIS — M25561 Pain in right knee: Secondary | ICD-10-CM | POA: Diagnosis not present

## 2019-07-27 DIAGNOSIS — G43009 Migraine without aura, not intractable, without status migrainosus: Secondary | ICD-10-CM

## 2019-07-27 DIAGNOSIS — M6281 Muscle weakness (generalized): Secondary | ICD-10-CM

## 2019-07-27 DIAGNOSIS — R262 Difficulty in walking, not elsewhere classified: Secondary | ICD-10-CM

## 2019-07-27 MED ORDER — DEXAMETHASONE SODIUM PHOSPHATE 10 MG/ML IJ SOLN
INTRAMUSCULAR | Status: AC
  Filled 2019-07-27: qty 1

## 2019-07-27 MED ORDER — ACETAMINOPHEN 325 MG PO TABS
975.0000 mg | ORAL_TABLET | Freq: Once | ORAL | Status: AC
  Administered 2019-07-27: 975 mg via ORAL

## 2019-07-27 MED ORDER — KETOROLAC TROMETHAMINE 30 MG/ML IJ SOLN
30.0000 mg | Freq: Once | INTRAMUSCULAR | Status: AC
  Administered 2019-07-27: 30 mg via INTRAMUSCULAR

## 2019-07-27 MED ORDER — ACETAMINOPHEN 325 MG PO TABS
ORAL_TABLET | ORAL | Status: AC
  Filled 2019-07-27: qty 3

## 2019-07-27 MED ORDER — TIZANIDINE HCL 4 MG PO TABS: 4.0000 mg | ORAL_TABLET | Freq: Three times a day (TID) | ORAL | 0 refills | Status: DC | PRN

## 2019-07-27 MED ORDER — IBUPROFEN 600 MG PO TABS: 600.0000 mg | ORAL_TABLET | Freq: Four times a day (QID) | ORAL | 0 refills | Status: DC | PRN

## 2019-07-27 MED ORDER — KETOROLAC TROMETHAMINE 30 MG/ML IJ SOLN
INTRAMUSCULAR | Status: AC
  Filled 2019-07-27: qty 1

## 2019-07-27 MED ORDER — DEXAMETHASONE SODIUM PHOSPHATE 10 MG/ML IJ SOLN
10.0000 mg | Freq: Once | INTRAMUSCULAR | Status: AC
  Administered 2019-07-27: 10 mg via INTRAMUSCULAR

## 2019-07-27 NOTE — Discharge Instructions (Addendum)
Take ibuprofen 600 mg / 1000 mg of Tylenol 3-4 times a day as needed for headache, drink plenty of extra fluids, Zanaflex as written. this is a muscle relaxant and will help if there is any musculoskeletal component to this headache.  Below is a list of primary care practices who are taking new patients for you to follow-up with.  [facility] internal medicine clinic Ground Floor - [facility], [address] [place], [place], [place] [address] [phone]  [facility] Primary Care at [facility] [address] [place], [place] [address] [phone]  [facility] [address] [place], [place] [address] [phone]  [facility] [phone] [address] [place] [address]  [facility]: [address] [place] [address]  [phone]  [facility]: [address] [place] [address]   [phone]  [facility]: [address] [place] [address]  [phone]  [facility] : [address] [place] [address] [phone]  [facility]: [address] [place] [address] [phone]  [facility]: [address] [facility] [place] [address] [phone]  Dr. Oneal Grout [address] [facility] [place] [address]  [phone]  Dr. Jackie Plum, [facility]. [address] [place], [place] [address]  [phone]  Go to www.goodrx.com to look up your medications. This will give you a list of where you can find your prescriptions at the most affordable prices. Or ask the pharmacist what the cash price is, or if they have any other discount programs available to help make your medication more affordable. This can be less  expensive than what you would pay with insurance.

## 2019-07-27 NOTE — ED Triage Notes (Signed)
Pt states she has migraine that started yesterday,

## 2019-07-27 NOTE — ED Provider Notes (Signed)
HPI  SUBJECTIVE:  Kathleen Duffy is a 29 y.o. female who reports  gradual onset frontal HA starting yesterday.    Intermittent, lasts hours. Has tried Tylenol, Excedrin.  Excedrin helps.  Symptoms are worse with lights.  Reports some blurry vision but states that this is normal for her when she gets her migraines. no fevers, N/V,  phonophobia, purulent nasal d/c, nasal congestion, sinus pain/pressure, ear pain, jaw pain, dental pain,  dysarthria, focal weakness, facial droop, discoordination. No body aches, neck stiffness, rash. No seizures, syncope.   Pt is not pregnant.  States is similar to previous migraines. past medical history negative for diabetes, hypertension, aneurysm, SAH/ICH, HIV, sinusitis,  stroke, atrial fibrillation. Pt is not on any antiplatelets/anticoagulants.  LMP: 2 weeks ago.  Denies the possibility of being pregnant.  PMD: None.  Neurology: None.  States that she is getting 5 or 6 headaches per month, states that they are getting more frequent.  History reviewed. No pertinent past medical history.  History reviewed. No pertinent surgical history.  Family History  Problem Relation Age of Onset  . Healthy Mother   . Healthy Father     Social History   Tobacco Use  . Smoking status: Former Smoker    Types: Cigars  . Smokeless tobacco: Never Used  Substance Use Topics  . Alcohol use: Yes  . Drug use: No    No current facility-administered medications for this encounter.  Current Outpatient Medications:  .  ibuprofen (ADVIL) 600 MG tablet, Take 1 tablet (600 mg total) by mouth every 6 (six) hours as needed., Disp: 30 tablet, Rfl: 0 .  tiZANidine (ZANAFLEX) 4 MG tablet, Take 1 tablet (4 mg total) by mouth every 8 (eight) hours as needed for muscle spasms., Disp: 30 tablet, Rfl: 0  No Known Allergies   ROS  As noted in HPI.   Physical Exam  BP 114/77 (BP Location: Right Arm)   Pulse 87   Temp 98.7 F (37.1 C) (Oral)   Resp 18   Wt 90.7 kg   LMP  07/13/2019   SpO2 99%   BMI 34.33 kg/m   Constitutional: Well developed, well nourished, no acute distress Eyes: PERRL, EOMI, conjunctiva normal bilaterally.  Positive mild direct photophobia right eye. HENT: Normocephalic, atraumatic,mucus membranes moist, some dental caries, no dental tenderness.  No gingival swelling.  TM normal b/l. No TMJ tenderness. . No nasal congestion, no sinus tenderness. No temporal artery tenderness.  Neck: no cervical LN + bilateral trapezial muscle tenderness. No meningismus.   Respiratory: normal inspiratory effort Cardiovascular: Normal rate, regular rhythm GI:  nondistended skin: No rash, skin intact Musculoskeletal: No edema, no tenderness, no deformities Neurologic: Alert & oriented x 3, CN III-XII intact, romberg neg, finger-> nose, heel-> shin equal b/l, Romberg neg, tandem gait steady Psychiatric: Speech and behavior appropriate   ED Course   Medications  acetaminophen (TYLENOL) tablet 975 mg (975 mg Oral Given 07/27/19 1643)  ketorolac (TORADOL) 30 MG/ML injection 30 mg (30 mg Intramuscular Given 07/27/19 1644)  dexamethasone (DECADRON) injection 10 mg (10 mg Intramuscular Given 07/27/19 1643)    No orders of the defined types were placed in this encounter.  No results found for this or any previous visit (from the past 24 hour(s)). No results found.   ED Clinical Impression  1. Migraine without aura and without status migrainosus, not intractable     ED Assessment/Plan  Pt describing typical pain, no sudden onset. Doubt SAH, ICH or space occupying lesion. Pt  without fevers/chills, Pt has no meningeal sx, no nuchal rigidity. Doubt meningitis. Pt with normal neuro exam, no evidence of CVA/TIA.  Pt BP not elevated significantly, doubt hypertensive emergency. No evidence of temporal artery tenderness, no evidence of glaucoma or other ocular pathology. Will give headache cocktail ( dexamethasone 10 IM toradol 30  IM, Tylenol 975 mg p.o.),  and  reassess.  Patient drove here, unable to give Benadryl.  She denies nausea or vomiting, withholding antiemetic.  Pt much improved after medications. Pt with continued non-focal neuro exam. Will d/c home with ibuprofen 600 mg / 1000 mg of Tylenol, Zanaflex as this may have a muscular component to it.  Work note for 2 days.  Primary care list for ongoing care.  We will also give her contact information to several neurologic practices here in [place] since she is getting 5-6 headaches a month.  discussed with her that she may benefit from prophylactic medications. Discussed MDM, plan for follow up, signs and sx that should prompt return to ER. Pt agrees with plan  Meds ordered this encounter  Medications  . acetaminophen (TYLENOL) tablet 975 mg  . ketorolac (TORADOL) 30 MG/ML injection 30 mg  . dexamethasone (DECADRON) injection 10 mg  . ibuprofen (ADVIL) 600 MG tablet    Sig: Take 1 tablet (600 mg total) by mouth every 6 (six) hours as needed.    Dispense:  30 tablet    Refill:  0  . tiZANidine (ZANAFLEX) 4 MG tablet    Sig: Take 1 tablet (4 mg total) by mouth every 8 (eight) hours as needed for muscle spasms.    Dispense:  30 tablet    Refill:  0    *This clinic note was created using [employer]. Therefore, there may be occasional mistakes despite careful proofreading.  ?    Melynda Ripple, MD 07/28/19 [phone]

## 2019-07-27 NOTE — Therapy (Signed)
[place], [place], [address] Phone: [phone]   Fax:  [phone]  Physical Therapy Treatment  Patient Details  Name: Latesha Chesney MRN: [id number] Date of Birth: Jun 12, 1990 Referring Provider (PT): Melrose Nakayama, MD   Encounter Date: 07/27/2019  PT End of Session - 07/27/19 1432    Visit Number  17    Number of Visits  29    Date for PT Re-Evaluation  08/25/19    Authorization Type  [employer]    PT Start Time  1417    PT Stop Time  1457    PT Time Calculation (min)  40 min    Activity Tolerance  Patient tolerated treatment well    Behavior During Therapy  [facility] for tasks assessed/performed       History reviewed. No pertinent past medical history.  History reviewed. No pertinent surgical history.  There were no vitals filed for this visit.  Subjective Assessment - 07/27/19 1424    Subjective  Pt. notes still having some difficulty with stairs/stepping motions and lifting activities. No significant pain today.    Currently in Pain?  No/denies                        [facility] Adult PT Treatment/Exercise - 07/27/19 0001      Knee/Hip Exercises: Stretches   Passive Hamstring Stretch  Right;3 reps;30 seconds      Knee/Hip Exercises: Aerobic   Elliptical  Ramp 3 L1 x 5 min      Knee/Hip Exercises: Machines for Strengthening   Cybex Leg Press  60 lbs. 3x10 bilat. LE    Hip Cybex  hip abd 2x10 ea. bilat. 25 lbs.      Knee/Hip Exercises: Standing   Lateral Step Up  Right;2 sets;10 reps;Hand Hold: 1;Step Height: 4"    Forward Step Up  Right;2 sets;10 reps    Forward Step Up Limitations  blue side BOSU    Step Down  Right;1 set;15 reps;Hand Hold: 1;Step Height: 2"    Functional Squat Limitations  Kettlebell front squat with 10 lbs. with touch to incline wedge on low table 2x10    Rebounder  R SLS on blue Theraband pad with left toe touch x 30 throws 1000g ball    Other Standing Knee  Exercises  TRX reverse lunge 2x10, side split squat right x 10 reps stopped due to pain      Knee/Hip Exercises: Seated   Long Arc Quad Limitations  seated Theraband partial LAQ 90-50 deg green band 3x10      Knee/Hip Exercises: Supine   Bridges with Clamshell  AROM;Strengthening;Both;20 reps   green band   Straight Leg Raises  AROM;Strengthening;Right;2 sets;10 reps               PT Short Term Goals - 06/30/19 1617      PT SHORT TERM GOAL #1   Title  Pt will be independent with initial HEP    Baseline  met    Time  4    Period  Weeks    Status  Achieved      PT SHORT TERM GOAL #2   Title  AROM of knee extension -10 to 100 flexion to increase mobility for transitional movements    Baseline  5-110    Time  4    Period  Weeks    Status  Achieved      PT SHORT TERM GOAL #3  Title  Demonstrate and verbalize understanding of condition management including RICE, positioning, use of A.D., HEP.    Baseline  met    Time  4    Period  Weeks    Status  Achieved        PT Long Term Goals - 07/20/19 1417      PT LONG TERM GOAL #1   Title  Pt will be independent with advanced HEP.    Baseline  HEP updates ongoing    Time  8    Period  Weeks    Status  On-going      PT LONG TERM GOAL #2   Title  Pt will improve R knee extensor/flexor strength to >/= 4+/5 without excerbating  pain greater than 2/10  to promote safety with walking/standing activities    Baseline  knee extension 4/5    Time  8    Period  Weeks    Status  On-going      PT LONG TERM GOAL #3   Title  PT with be able to walk/stand >/= 1 hour with no AD with </= 2/10 pain for functional endurance and return to leisure activities post DC including walking dog    Baseline  progressing but still with some issues with intermittent swelling and soreness    Time  8    Period  Weeks    Status  On-going      PT LONG TERM GOAL #4   Title  FOTO will improve from  68% limitation  to  38% limitation     indicating  improved functional mobility .    Baseline  78% limited, continue goal    Time  8    Period  Weeks    Status  On-going      PT LONG TERM GOAL #5   Title  Pt will improve her R knee flexion to  >/= 120 degrees and extension to </= 5 degrees with </= 2/10 pain for a more functional and efficient gait pattern    Baseline  -4 deg extension from neutral to 121 deg flexion, pain intermittently higher but ROM goal met    Time  8    Period  Weeks    Status  Partially Met      PT LONG TERM GOAL #6   Title  Pt will be able to carry up to 50lb in order to return to work at [employer] center for work    Baseline  returned to partial duty with 4 hour shifts-lifting goal ongoing    Time  8    Period  Weeks    Status  On-going            Plan - 07/27/19 1432    Clinical Impression Statement  Primary limitations at this point limiting functional status are due to quad weakness-still lacks eccentric control with stepping motions-making gradual progess wih strengthening but still weak and needing continued PT for strengthening.    Personal Factors and Comorbidities  Profession    Examination-Activity Limitations  Lift;Squat;Stairs;Locomotion Level;Stand    Stability/Clinical Decision Making  Evolving/Moderate complexity    Clinical Decision Making  Low    Rehab Potential  Good    PT Frequency  2x / week    PT Duration  8 weeks    PT Treatment/Interventions  ADLs/Self Care Home Management;Electrical Stimulation;Cryotherapy;Moist Heat;Ultrasound;Gait training;Stair training;Functional mobility training;Therapeutic activities;Therapeutic exercise;Neuromuscular re-education;Patient/family education;Scar mobilization;Passive range of motion;Manual techniques;Dry needling;Taping;Vasopneumatic Device  PT Next Visit Plan  s/p right ACL surgery 03-31-19-progress quad/hip strengthening with closed chain activities, for now continue bike vs. elliiptical, continue balance/proprioceptive  challenges, knee ROM as needed    PT Home Exercise Plan  SLR, heel slide, squat, forward and lateral step ups, leg press, hip abd SLR, SLS, Rebounder toss at gym, hip bridge    Consulted and Agree with Plan of Care  Patient       Patient will benefit from skilled therapeutic intervention in order to improve the following deficits and impairments:  Abnormal gait, Decreased activity tolerance, Decreased mobility, Decreased range of motion, Decreased strength, Increased edema, Difficulty walking, Pain  Visit Diagnosis: Acute pain of right knee  Difficulty in walking, not elsewhere classified  Localized edema  Muscle weakness (generalized)     Problem List There are no problems to display for this patient.   Beaulah Dinning, PT, DPT 07/27/19 2:58 PM  [facility] [address] [place], [place], [address] Phone: [phone]   Fax:  [phone]  Name: Donette Mainwaring MRN: [id number] Date of Birth: 1990-09-20

## 2019-08-01 ENCOUNTER — Ambulatory Visit: Payer: 59 | Admitting: Physical Therapy

## 2019-08-03 ENCOUNTER — Ambulatory Visit: Payer: 59 | Admitting: Physical Therapy

## 2019-08-03 ENCOUNTER — Telehealth: Payer: Self-pay | Admitting: Physical Therapy

## 2019-08-03 NOTE — Telephone Encounter (Signed)
Attempted to call patient regarding no show for 1:30 PM therapy appointment-left voicemail including reminder next appointment time.

## 2019-08-08 ENCOUNTER — Telehealth: Payer: Self-pay | Admitting: Physical Therapy

## 2019-08-08 ENCOUNTER — Ambulatory Visit: Payer: 59 | Admitting: Physical Therapy

## 2019-08-08 NOTE — Telephone Encounter (Signed)
Called and left voicemail regarding no show for 2:15 therapy appointment today. Informed would keep next visit on 08/10/19 as scheduled but due to second no show in a row and overall high number of cancels/no shows would cancel remaining appointments after 08/10/19 and move to scheduling 1 session at a time per facility attendance policy.

## 2019-08-10 ENCOUNTER — Ambulatory Visit: Payer: 59 | Admitting: Physical Therapy

## 2019-08-10 ENCOUNTER — Telehealth: Payer: Self-pay | Admitting: Physical Therapy

## 2019-08-10 NOTE — Telephone Encounter (Signed)
Called and spoke with patient regarding missed therapy appointment today. She reports missing recent therapy due to insurance issues and states had tried to call to cancel/left voicemails to clinic re: appointments. Informed remaining/previously scheduled visits after today had been cancelled due to facility attendance policy so no further visits currently scheduled. She reports will try to get insurance status taken care of for therapy visits and will contact clinic to reschedule and in the meantime will continue with gym exercises to strengthen knee.

## 2019-08-17 ENCOUNTER — Encounter: Payer: Self-pay | Admitting: Physical Therapy

## 2019-08-22 ENCOUNTER — Encounter: Payer: Self-pay | Admitting: Physical Therapy

## 2019-08-24 ENCOUNTER — Encounter: Payer: Self-pay | Admitting: Physical Therapy

## 2019-08-29 ENCOUNTER — Encounter: Payer: Self-pay | Admitting: Physical Therapy

## 2019-08-31 ENCOUNTER — Encounter: Payer: Self-pay | Admitting: Physical Therapy

## 2019-08-31 NOTE — Therapy (Signed)
[place], [place], [address] Phone: [phone]   Fax:  [phone]  Physical Therapy Treatment/Discharge  Patient Details  Name: Kathleen Duffy MRN: [id number] Date of Birth: 07-Sep-1990 Referring Provider (PT): Melrose Nakayama, MD   Encounter Date: 07/27/2019    History reviewed. No pertinent past medical history.  History reviewed. No pertinent surgical history.  There were no vitals filed for this visit.                                PT Short Term Goals - 06/30/19 1617      PT SHORT TERM GOAL #1   Title Pt will be independent with initial HEP    Baseline met    Time 4    Period Weeks    Status Achieved      PT SHORT TERM GOAL #2   Title AROM of knee extension -10 to 100 flexion to increase mobility for transitional movements    Baseline 5-110    Time 4    Period Weeks    Status Achieved      PT SHORT TERM GOAL #3   Title Demonstrate and verbalize understanding of condition management including RICE, positioning, use of A.D., HEP.    Baseline met    Time 4    Period Weeks    Status Achieved             PT Long Term Goals - 07/20/19 1417      PT LONG TERM GOAL #1   Title Pt will be independent with advanced HEP.    Baseline HEP updates ongoing    Time 8    Period Weeks    Status On-going      PT LONG TERM GOAL #2   Title Pt will improve R knee extensor/flexor strength to >/= 4+/5 without excerbating  pain greater than 2/10  to promote safety with walking/standing activities    Baseline knee extension 4/5    Time 8    Period Weeks    Status On-going      PT LONG TERM GOAL #3   Title PT with be able to walk/stand >/= 1 hour with no AD with </= 2/10 pain for functional endurance and return to leisure activities post DC including walking dog    Baseline progressing but still with some issues with intermittent swelling and soreness    Time 8     Period Weeks    Status On-going      PT LONG TERM GOAL #4   Title FOTO will improve from  68% limitation  to  38% limitation     indicating improved functional mobility .    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    Baseline returned to partial duty with 4 hour shifts-lifting goal ongoing    Time 8    Period Weeks    Status On-going                  Patient will benefit from skilled therapeutic intervention in order to improve the following deficits and impairments:  Abnormal gait, Decreased activity tolerance, Decreased mobility, Decreased range of motion, Decreased strength, Increased edema, Difficulty walking, Pain  Visit Diagnosis: Acute pain of right knee  Difficulty in walking, not elsewhere classified  Localized edema  Muscle weakness (generalized)     Problem List There are no problems to display for this patient.       PHYSICAL THERAPY DISCHARGE SUMMARY  Visits from Start of Care: 17  Current functional level related to goals / functional outcomes: Patient did not return for further therapy visits after 07/27/19-she had reported issues related to insurance coverage. No further visits scheduled at this time-if needing to return to therapy in the future will await new therapy order if recommended by MD.   Remaining deficits: Current status unknown, previous knee weakness and stiffness as of last therapy attended   Education /  Equipment: HEP Plan:                                                    Patient goals were partially met. Patient is being discharged due to not returning since the last visit.  ?????           Beaulah Dinning, PT, DPT 08/31/19 4:32 PM     [facility] [address] [place], [place], [address] Phone: [phone]   Fax:  [phone]  Name: Kathleen Duffy MRN: [id number] Date of Birth: 08-Feb-1991

## 2019-09-01 ENCOUNTER — Ambulatory Visit: Payer: 59 | Attending: Internal Medicine

## 2019-09-01 DIAGNOSIS — Z23 Encounter for immunization: Secondary | ICD-10-CM

## 2019-09-01 NOTE — Progress Notes (Signed)
   Covid-19 Vaccination Clinic  Name:  Kathleen Duffy    MRN: [id number] DOB: Aug 13, 1990  09/01/2019  Kathleen Duffy was observed post Covid-19 immunization for 15 minutes without incident. She was provided with Vaccine Information Sheet and instruction to access the V-Safe system.   Kathleen Duffy was instructed to call [phone] with any severe reactions post vaccine: Marland Kitchen Difficulty breathing  . Swelling of face and throat  . A fast heartbeat  . A bad rash all over body  . Dizziness and weakness   Immunizations Administered    Name Date Dose VIS Date Route   JANSSEN COVID-19 VACCINE 09/01/2019  4:43 PM 0.5 mL 05/14/2019 Intramuscular   Manufacturer: [employer]   Lot: [id number]   NDC: 17471-595-39

## 2019-09-05 ENCOUNTER — Encounter: Payer: Self-pay | Admitting: Physical Therapy

## 2019-09-07 ENCOUNTER — Encounter: Payer: Self-pay | Admitting: Physical Therapy

## 2019-12-26 ENCOUNTER — Other Ambulatory Visit: Payer: Self-pay

## 2019-12-26 ENCOUNTER — Ambulatory Visit (HOSPITAL_COMMUNITY)
Admission: EM | Admit: 2019-12-26 | Discharge: 2019-12-26 | Disposition: A | Discharge status: Home / Self Care | Payer: HRSA Program | Attending: Internal Medicine | Admitting: Internal Medicine

## 2019-12-26 ENCOUNTER — Encounter (HOSPITAL_COMMUNITY): Payer: Self-pay | Admitting: *Deleted

## 2019-12-26 DIAGNOSIS — Z3202 Encounter for pregnancy test, result negative: Secondary | ICD-10-CM | POA: Diagnosis not present

## 2019-12-26 DIAGNOSIS — Z87891 Personal history of nicotine dependence: Secondary | ICD-10-CM | POA: Insufficient documentation

## 2019-12-26 DIAGNOSIS — A084 Viral intestinal infection, unspecified: Secondary | ICD-10-CM | POA: Insufficient documentation

## 2019-12-26 DIAGNOSIS — Z20822 Contact with and (suspected) exposure to covid-19: Secondary | ICD-10-CM | POA: Diagnosis not present

## 2019-12-26 HISTORY — DX: Migraine, unspecified, not intractable, without status migrainosus: G43.909

## 2019-12-26 LAB — POC URINE PREG, ED: Preg Test, Ur: NEGATIVE

## 2019-12-26 MED ORDER — ONDANSETRON 4 MG PO TBDP: 4.0000 mg | ORAL_TABLET | Freq: Three times a day (TID) | ORAL | 0 refills | Status: DC | PRN

## 2019-12-26 NOTE — Discharge Instructions (Addendum)
Push oral fluids Take medications as recommended If symptoms worsen please return to the urgent care to be reevaluated. Please quarantine until COVID-19 test results are available

## 2019-12-26 NOTE — ED Provider Notes (Signed)
[facility]    CSN: [id number] Arrival date & time: 12/26/19  1526      History   Chief Complaint Chief Complaint  Patient presents with  . Diarrhea  . Nausea  . Cough    HPI Kathleen Duffy is a 29 y.o. female who is at the clinic today for a 3-day history of nausea, vomiting, diarrhea.  Patient states that she has been unable to keep any food or drink down due to the severe nausea.  No known sick contacts.  No known Covid contacts.  Denies bloody emesis or diarrhea.  Describes pain as generalized around the periumbilical region.  Has not tried anything for the pain or for nausea.  Has been attempting to drink large amounts of Gatorade to stay hydrated.  Also admits to occasional dysuria.  No known history of UTIs.  Denies fevers, chills, chest pain, difficulty breathing, cough.  Patient works at [employer].  She denies any dysuria at this time.  Occasionally she will get dysuria with no urgency or frequency.  No abdominal distention or bloating.  No sore throat.  No loss of taste or smell.  Patient is vaccinated against COVID-19 virus.    Past Medical History:  Diagnosis Date  . Migraines     There are no problems to display for this patient.   History reviewed. No pertinent surgical history.  OB History   No obstetric history on file.      Home Medications    Prior to Admission medications   Medication Sig Start Date End Date Taking? Authorizing Provider  ibuprofen (ADVIL) 600 MG tablet Take 1 tablet (600 mg total) by mouth every 6 (six) hours as needed. 07/27/19   Kathleen Gong, MD  tiZANidine (ZANAFLEX) 4 MG tablet Take 1 tablet (4 mg total) by mouth every 8 (eight) hours as needed for muscle spasms. 07/27/19   Kathleen Gong, MD    Family History Family History  Problem Relation Age of Onset  . Healthy Mother   . Healthy Father     Social History Social History   Tobacco Use  . Smoking status: Former Smoker    Types: Cigars  . Smokeless  tobacco: Never Used  Substance Use Topics  . Alcohol use: Yes  . Drug use: No     Allergies   Patient has no known allergies.   Review of Systems Review of Systems See HPI  Physical Exam Triage Vital Signs ED Triage Vitals  Enc Vitals Group     BP 12/26/19 1653 129/88     Pulse Rate 12/26/19 1653 85     Resp 12/26/19 1653 16     Temp 12/26/19 1653 98.6 F (37 C)     Temp Source 12/26/19 1653 Oral     SpO2 12/26/19 1653 98 %     Weight 12/26/19 1654 200 lb (90.7 kg)     Height 12/26/19 1654 5\' 4"  (1.626 m)     Head Circumference --      Peak Flow --      Pain Score 12/26/19 1654 0     Pain Loc --      Pain Edu? --      Excl. in GC? --    No data found.  Updated Vital Signs BP 129/88 (BP Location: Left Arm)   Pulse 85   Temp 98.6 F (37 C) (Oral)   Resp 16   Ht 5\' 4"  (1.626 m)   Wt 90.7 kg   LMP 12/19/2019  SpO2 98%   BMI 34.33 kg/m   Visual Acuity Right Eye Distance:   Left Eye Distance:   Bilateral Distance:    Right Eye Near:   Left Eye Near:    Bilateral Near:     Physical Exam Vitals and nursing note reviewed.  Constitutional:      General: She is not in acute distress.    Appearance: She is well-developed.  HENT:     Head: Normocephalic and atraumatic.  Eyes:     Conjunctiva/sclera: Conjunctivae normal.  Cardiovascular:     Rate and Rhythm: Normal rate and regular rhythm.     Heart sounds: No murmur heard.   Pulmonary:     Effort: Pulmonary effort is normal. No respiratory distress.     Breath sounds: Normal breath sounds.  Abdominal:     General: There is no distension.     Palpations: Abdomen is soft.     Tenderness: There is abdominal tenderness. There is no right CVA tenderness, left CVA tenderness, guarding or rebound.     Hernia: No hernia is present.  Musculoskeletal:     Cervical back: Neck supple.  Skin:    General: Skin is warm and dry.  Neurological:     General: No focal deficit present.     Mental Status: She is  alert and oriented to person, place, and time.  Psychiatric:        Mood and Affect: Mood normal.      UC Treatments / Results  Labs (all labs ordered are listed, but only abnormal results are displayed) Labs Reviewed  SARS CORONAVIRUS 2 (TAT 6-24 HRS)  POC URINE PREG, ED    EKG   Radiology No results found.  Procedures Procedures (including critical care time)  Medications Ordered in UC Medications - No data to display  Initial Impression / Assessment and Plan / UC Course  I have reviewed the triage vital signs and the nursing notes.  Pertinent labs & imaging results that were available during my care of the patient were reviewed by me and considered in my medical decision making (see chart for details).    1.  Viral gastroenteritis: Pregnancy test negative. Will treat conservatively for suspected gastroenteritis.  Zofran for nausea. Encourage fluid intake. Pending Covid test.  Please quarantine until Covid test results is available Patient given instructions for return. Final Clinical Impressions(s) / UC Diagnoses   Final diagnoses:  None   Discharge Instructions   None    ED Prescriptions    None     PDMP not reviewed this encounter.   Merrilee Jansky, MD 12/26/19 1735

## 2019-12-27 LAB — SARS CORONAVIRUS 2 (TAT 6-24 HRS): SARS Coronavirus 2: NEGATIVE

## 2021-03-09 IMAGING — MR MR KNEE*R* W/O CM
4 of 7 series · 21 of 40 positions shown · non-contrast
Comparison: None.

CLINICAL DATA: Knee pain, tripped over something 3 weeks ago can
not bend knee after that

EXAM:
MRI OF THE RIGHT KNEE WITHOUT CONTRAST
TECHNIQUE: Multiplanar, multisequence MR imaging of the knee was performed. No
intravenous contrast was administered.

[Series 3: T2 fat-sat · axial · 4.0mm · 0.31mm/px · z∈[-45,+61]mm · 5 of 25 slices shown]
[im 1/25]
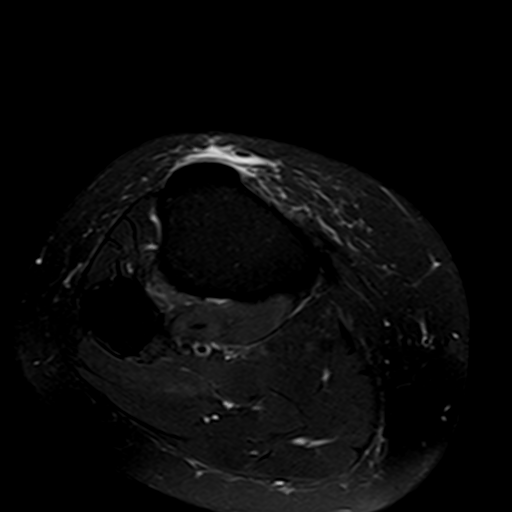
[im 7/25]
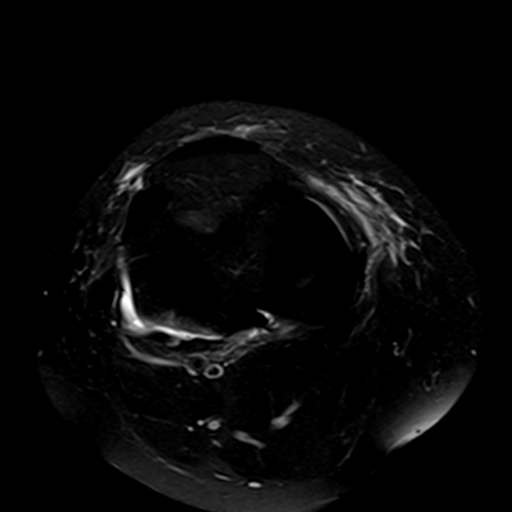
[im 13/25]
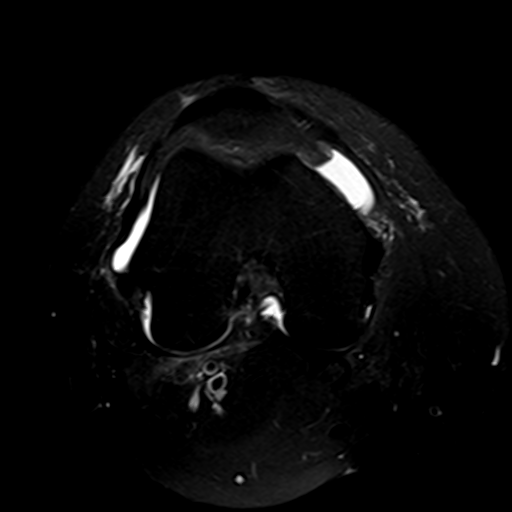
[im 19/25]
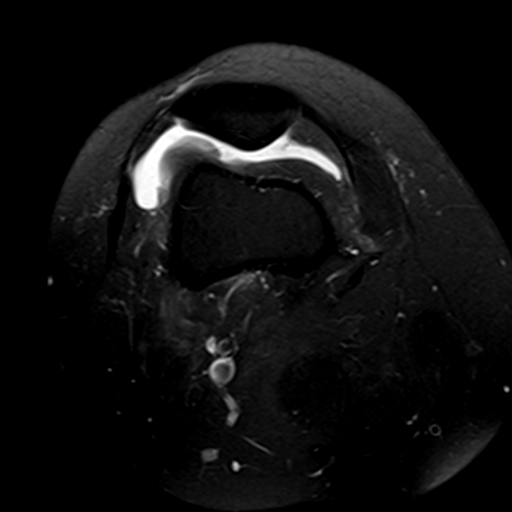
[im 25/25]
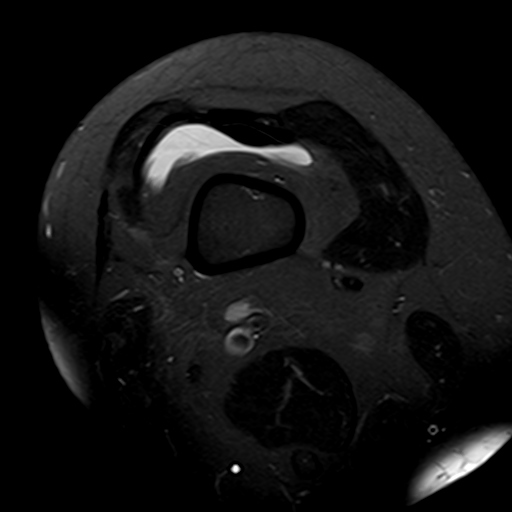

[Series 6: PD fat-sat · coronal · 4.0mm · 0.39mm/px · 6 of 24 slices shown (1 of 3)]
[im 1/24]
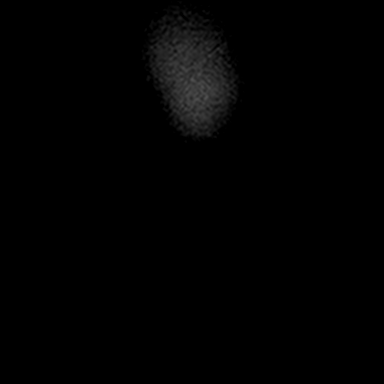
[im 5/24]
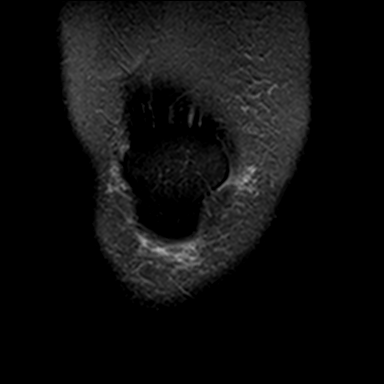
[im 10/24]
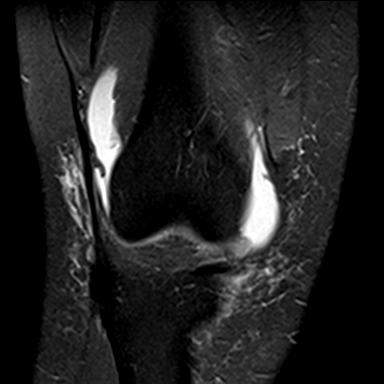
[im 14/24]
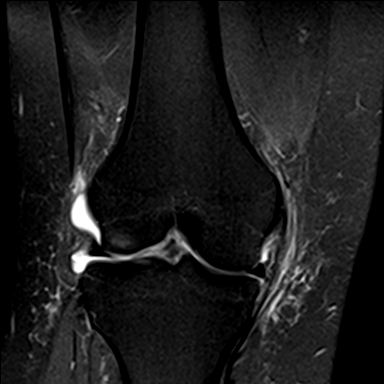
[im 19/24]
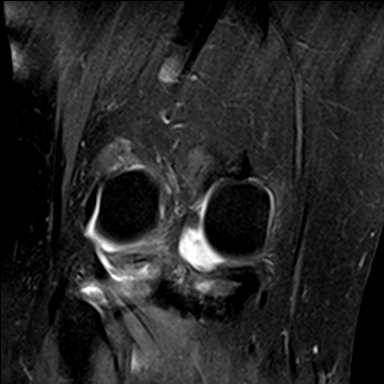
[im 24/24]
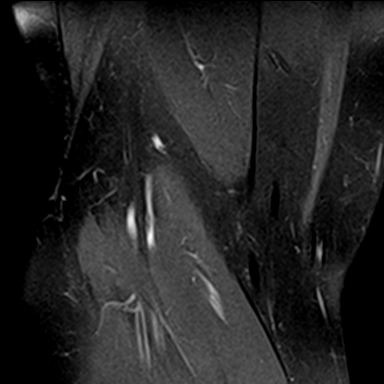

[Series 7: PD fat-sat · sagittal · 3.0mm · 0.29mm/px · 7 of 30 slices shown (2 of 3)]
[im 1/30]
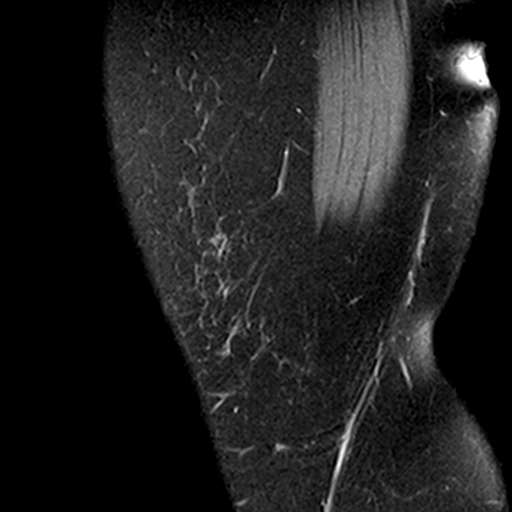
[im 5/30]
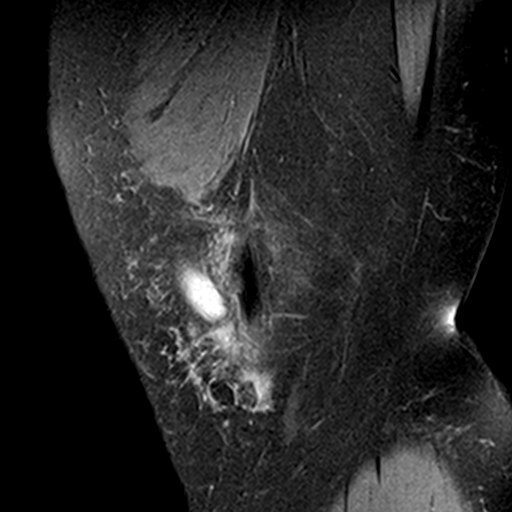
[im 10/30]
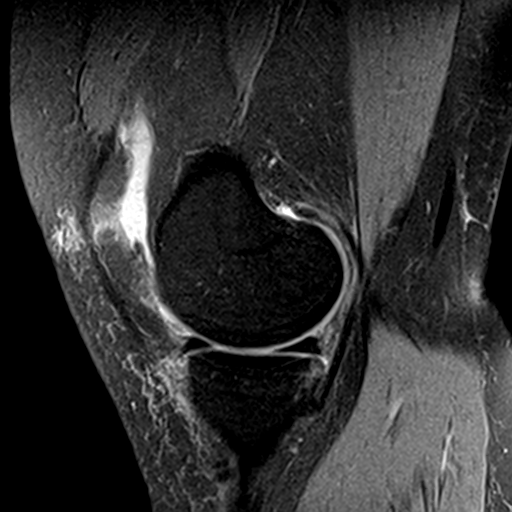
[im 15/30]
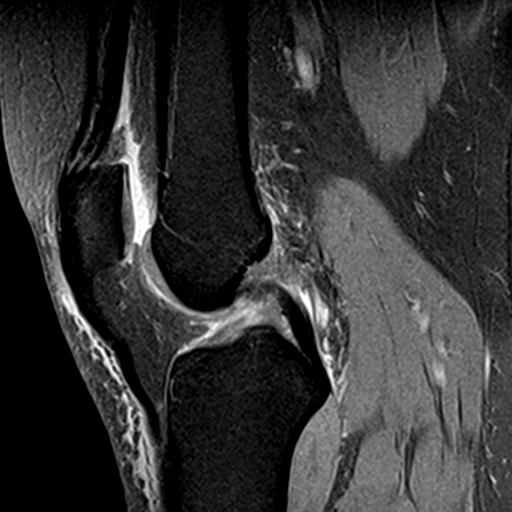
[im 20/30]
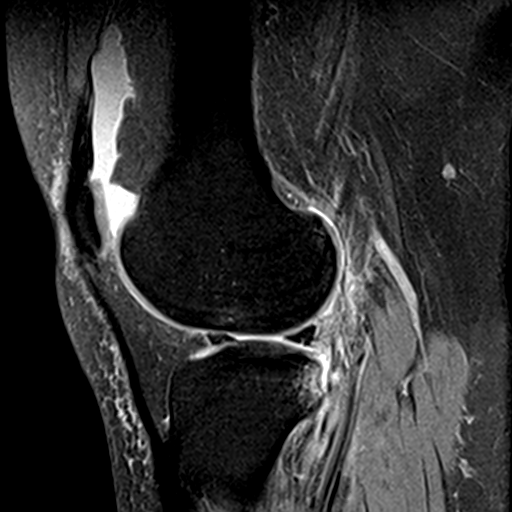
[im 25/30]
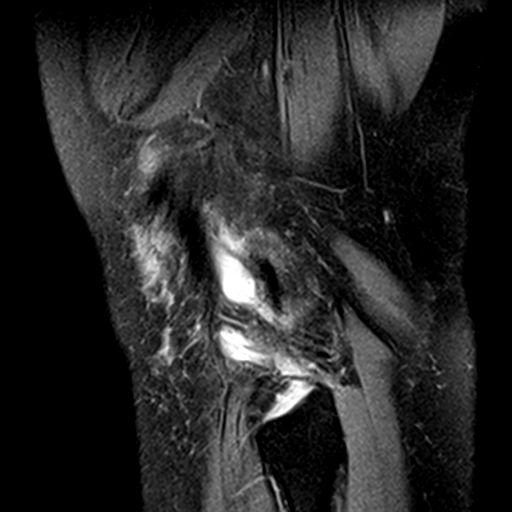
[im 30/30]
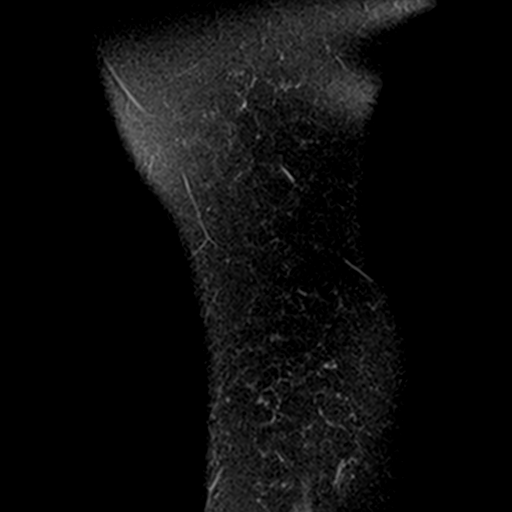

[Series 9: PD fat-sat · oblique · 2.0mm · 0.29mm/px · 3 of 11 slices shown (3 of 3)]
[im 1/11]
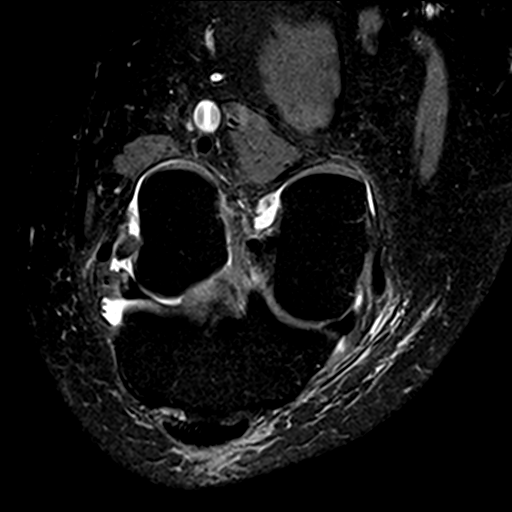
[im 6/11]
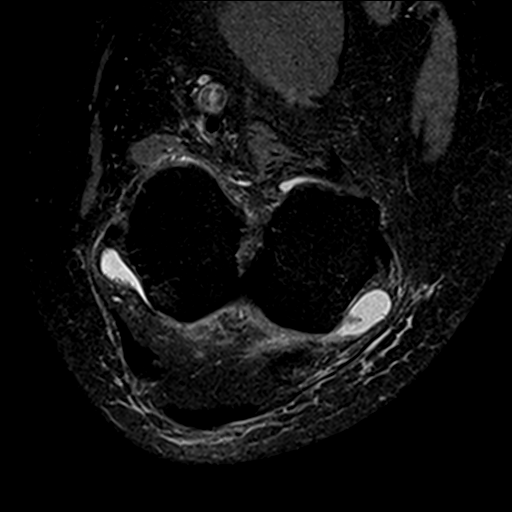
[im 11/11]
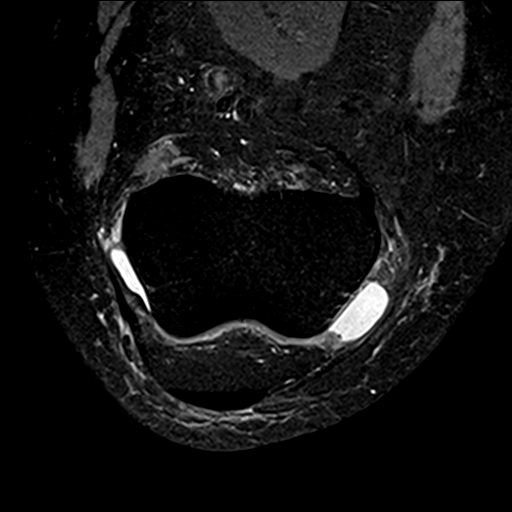

[21 of 40 positions shown; findings below may reference images not displayed]

FINDINGS: MENISCI

Medial: Intact.

Lateral: There is a nondisplaced horizontal longitudinal tear seen
of the posterior horn of the lateral meniscus extending to the mid
body and anterior horn. The root attachment however still intact.

LIGAMENTS

Cruciates: There is increased heterogeneous signal seen within the
ACL with a partial tear of the anteromedial bundle. There are
however intact fibers seen in the posterior lateral bundle. There is
edema seen within the intracondylar notch. The PCL is intact.

Collaterals: There is mildly increased signal seen around the
superficial fibers of the MCL, however it is intact. Mildly
increased signal seen within the fibular collateral ligament,
however it is intact. The remainder of the lateral collateral
ligamentous complex is intact.

CARTILAGE

Patellofemoral: Normal.

Medial compartment: Normal.

Lateral compartment: Normal.

BONES: Increased marrow signal seen at the lateral femoral condyle,
posterior lateral tibial plateau and posterior medial tibial
plateau. No osseous fracture however is seen. No pathologic marrow
infiltration.

JOINT: A moderate knee joint effusion is noted. Normal Haoven
Inga. No plical thickening.

EXTENSOR MECHANISM: Mildly increased signal seen at the insertion
site of the quadriceps tendon however it is intact. The patellar
tendon is intact. The retinaculum is unremarkable.

POPLITEAL FOSSA: No popliteal cyst.

OTHER:  The visualized muscles are normal in appearance.
IMPRESSION: 1. Nondisplaced tear of the lateral meniscus.  No paralabral cyst.
2. Partial tear anteromedial bundle of the ACL with edema in the
intracondylar notch.
3. Mild intrasubstance sprain of the MCL and fibular collateral
ligament.
4. Osseous contusions as described above.  No osseous fracture.
5. Moderate knee joint effusion
6. Insertional quadriceps tendinosis

## 2021-06-10 ENCOUNTER — Other Ambulatory Visit: Payer: Self-pay

## 2021-06-10 ENCOUNTER — Encounter: Payer: Self-pay | Admitting: Emergency Medicine

## 2021-06-10 ENCOUNTER — Ambulatory Visit
Admission: EM | Admit: 2021-06-10 | Discharge: 2021-06-10 | Disposition: A | Discharge status: Home / Self Care | Payer: 59 | Attending: Physician Assistant | Admitting: Physician Assistant

## 2021-06-10 DIAGNOSIS — G43909 Migraine, unspecified, not intractable, without status migrainosus: Secondary | ICD-10-CM

## 2021-06-10 MED ORDER — NAPROXEN 500 MG PO TABS: 500.0000 mg | ORAL_TABLET | Freq: Two times a day (BID) | ORAL | 0 refills | Status: DC

## 2021-06-10 MED ORDER — KETOROLAC TROMETHAMINE 30 MG/ML IJ SOLN: 30.0000 mg | Freq: Once | INTRAMUSCULAR | Status: DC

## 2021-06-10 MED ORDER — DEXAMETHASONE SODIUM PHOSPHATE 10 MG/ML IJ SOLN: 10.0000 mg | Freq: Once | INTRAMUSCULAR | Status: DC

## 2021-06-10 NOTE — ED Triage Notes (Signed)
Migraine starting 4 days ago. Hx of same. Was prescribed something here last year that helped.  ?

## 2021-06-11 ENCOUNTER — Encounter: Payer: Self-pay | Admitting: Physician Assistant

## 2021-06-11 NOTE — ED Provider Notes (Signed)
?[facility] ? ? ? ?CSN: [id number] ?Arrival date & time: 06/10/21  1818 ? ? ?  ? ?History   ?Chief Complaint ?Chief Complaint  ?Patient presents with  ? Migraine  ? ? ?HPI ?Kathleen Duffy is a 31 y.o. female.  ? ?Patient here today for evaluation of migraine that started 4 days ago. She reports that she has history of migraines and was treated last year with resolution. She has some photosensitivity. She has tried taking OTC meds without resolution.  ? ?The history is provided by the patient.  ?Migraine ?Associated symptoms include headaches. Pertinent negatives include no abdominal pain and no shortness of breath.  ? ?Past Medical History:  ?Diagnosis Date  ? Migraines   ? ? ?There are no problems to display for this patient. ? ? ?History reviewed. No pertinent surgical history. ? ?OB History   ?No obstetric history on file. ?  ? ? ? ?Home Medications   ? ?Prior to Admission medications   ?Medication Sig Start Date End Date Taking? Authorizing Provider  ?naproxen (NAPROSYN) 500 MG tablet Take 1 tablet (500 mg total) by mouth 2 (two) times daily. 06/10/21  Yes Tomi Bamberger, PA-C  ?ondansetron (ZOFRAN ODT) 4 MG disintegrating tablet Take 1 tablet (4 mg total) by mouth every 8 (eight) hours as needed for nausea or vomiting. 12/26/19   Lamptey, Britta Mccreedy, MD  ? ? ?Family History ?Family History  ?Problem Relation Age of Onset  ? Healthy Mother   ? Healthy Father   ? ? ?Social History ?Social History  ? ?Tobacco Use  ? Smoking status: Former  ?  Types: Cigars  ? Smokeless tobacco: Never  ?Substance Use Topics  ? Alcohol use: Yes  ? Drug use: No  ? ? ? ?Allergies   ?Patient has no known allergies. ? ? ?Review of Systems ?Review of Systems  ?Constitutional:  Negative for chills and fever.  ?Eyes:  Negative for discharge and redness.  ?Respiratory:  Negative for shortness of breath.   ?Gastrointestinal:  Positive for nausea. Negative for abdominal pain and vomiting.  ?Neurological:  Positive for headaches.   ? ? ?Physical Exam ?Triage Vital Signs ?ED Triage Vitals  ?Enc Vitals Group  ?   BP 06/10/21 1914 131/84  ?   Pulse Rate 06/10/21 1914 97  ?   Resp 06/10/21 1914 16  ?   Temp 06/10/21 1914 98 ?F (36.7 ?C)  ?   Temp Source 06/10/21 1914 Oral  ?   SpO2 06/10/21 1914 98 %  ?   Weight --   ?   Height --   ?   Head Circumference --   ?   Peak Flow --   ?   Pain Score 06/10/21 1915 9  ?   Pain Loc --   ?   Pain Edu? --   ?   Excl. in GC? --   ? ?No data found. ? ?Updated Vital Signs ?BP 131/84 (BP Location: Left Arm)   Pulse 97   Temp 98 ?F (36.7 ?C) (Oral)   Resp 16   SpO2 98%  ?   ? ?Physical Exam ?Vitals and nursing note reviewed.  ?Constitutional:   ?   General: She is not in acute distress. ?   Appearance: Normal appearance. She is not ill-appearing.  ?HENT:  ?   Head: Normocephalic and atraumatic.  ?   Nose: Nose normal. No congestion or rhinorrhea.  ?Eyes:  ?   Extraocular Movements: Extraocular movements intact.  ?  Conjunctiva/sclera: Conjunctivae normal.  ?   Pupils: Pupils are equal, round, and reactive to light.  ?Cardiovascular:  ?   Rate and Rhythm: Normal rate.  ?Pulmonary:  ?   Effort: Pulmonary effort is normal.  ?Neurological:  ?   Mental Status: She is alert.  ?Psychiatric:     ?   Mood and Affect: Mood normal.     ?   Behavior: Behavior normal.     ?   Thought Content: Thought content normal.  ? ? ? ?UC Treatments / Results  ?Labs ?(all labs ordered are listed, but only abnormal results are displayed) ?Labs Reviewed - No data to display ? ?EKG ? ? ?Radiology ?No results found. ? ?Procedures ?Procedures (including critical care time) ? ?Medications Ordered in UC ?Medications - No data to display ? ?Initial Impression / Assessment and Plan / UC Course  ?I have reviewed the triage vital signs and the nursing notes. ? ?Pertinent labs & imaging results that were available during my care of the patient were reviewed by me and considered in my medical decision making (see chart for details). ? ?   ?Initially ordered injections as given last year but patient refuses, and requests oral anti-inflammatory. Naproxen prescribed but discussed that injections would likely be more beneficial. Encouraged follow up with any further concerns.  ? ?Final Clinical Impressions(s) / UC Diagnoses  ? ?Final diagnoses:  ?Migraine without status migrainosus, not intractable, unspecified migraine type  ? ?Discharge Instructions   ?None ?  ? ?ED Prescriptions   ? ? Medication Sig Dispense Auth. Provider  ? naproxen (NAPROSYN) 500 MG tablet Take 1 tablet (500 mg total) by mouth 2 (two) times daily. 30 tablet Tomi Bamberger, PA-C  ? ?  ? ?PDMP not reviewed this encounter. ?  ?Tomi Bamberger, PA-C ?06/11/21 [phone] ? ?

## 2021-08-15 ENCOUNTER — Ambulatory Visit: Payer: 59 | Admitting: Family Medicine

## 2021-08-28 ENCOUNTER — Encounter (HOSPITAL_COMMUNITY): Payer: Self-pay | Admitting: *Deleted

## 2021-08-28 ENCOUNTER — Ambulatory Visit (HOSPITAL_COMMUNITY)
Admission: EM | Admit: 2021-08-28 | Discharge: 2021-08-28 | Disposition: A | Discharge status: Home / Self Care | Payer: Commercial Managed Care - HMO | Attending: Emergency Medicine | Admitting: Emergency Medicine

## 2021-08-28 ENCOUNTER — Other Ambulatory Visit: Payer: Self-pay

## 2021-08-28 DIAGNOSIS — J329 Chronic sinusitis, unspecified: Secondary | ICD-10-CM

## 2021-08-28 DIAGNOSIS — J989 Respiratory disorder, unspecified: Secondary | ICD-10-CM | POA: Diagnosis not present

## 2021-08-28 DIAGNOSIS — J310 Chronic rhinitis: Secondary | ICD-10-CM | POA: Diagnosis not present

## 2021-08-28 DIAGNOSIS — T7840XA Allergy, unspecified, initial encounter: Secondary | ICD-10-CM

## 2021-08-28 DIAGNOSIS — R058 Other specified cough: Secondary | ICD-10-CM

## 2021-08-28 MED ORDER — FEXOFENADINE HCL 180 MG PO TABS: 180.0000 mg | ORAL_TABLET | Freq: Every day | ORAL | 1 refills | Status: DC

## 2021-08-28 MED ORDER — PREDNISONE 20 MG PO TABS: 20.0000 mg | ORAL_TABLET | Freq: Every day | ORAL | 0 refills | Status: DC

## 2021-08-28 MED ORDER — FLUTICASONE PROPIONATE 50 MCG/ACT NA SUSP: 1.0000 | Freq: Every day | NASAL | 1 refills | Status: DC

## 2021-08-28 MED ORDER — PROMETHAZINE-DM 6.25-15 MG/5ML PO SYRP: 5.0000 mL | ORAL_SOLUTION | Freq: Four times a day (QID) | ORAL | 0 refills | Status: DC | PRN

## 2021-08-28 NOTE — ED Provider Notes (Signed)
[place]    CSN: [id number] Arrival date & time: 08/28/21  1959    HISTORY   Chief Complaint  Patient presents with   Cough   HPI Kathleen Duffy is a 31 y.o. female. Patient presents to urgent care complaining of 1 week history of cough.  Patient states she has used over-the-counter cold and flu preparations with little relief of her symptoms.  Patient endorses a history of allergies however not currently using allergy medication.  Patient states the cough is dry, nonproductive.  Patient states cough is worse at night.  Patient states she is not sleeping secondary to cough.  Patient denies fever, aches, chills, nausea, vomiting, diarrhea, headache, sore throat.  The history is provided by the patient.   Past Medical History:  Diagnosis Date   Migraines    There are no problems to display for this patient.  History reviewed. No pertinent surgical history. OB History   No obstetric history on file.    Home Medications    Prior to Admission medications   Medication Sig Start Date End Date Taking? Authorizing Provider  fexofenadine (ALLEGRA) 180 MG tablet Take 1 tablet (180 mg total) by mouth daily. 08/28/21 02/24/22 Yes Lynden Oxford Scales, PA-C  fluticasone (FLONASE) 50 MCG/ACT nasal spray Place 1 spray into both nostrils daily. Begin by using 2 sprays in each nare daily for 3 to 5 days, then decrease to 1 spray in each nare daily. 08/28/21  Yes Lynden Oxford Scales, PA-C  predniSONE (DELTASONE) 20 MG tablet Take 1 tablet (20 mg total) by mouth daily with breakfast for 3 days. 08/28/21 08/31/21 Yes Lynden Oxford Scales, PA-C  promethazine-dextromethorphan (PROMETHAZINE-DM) 6.25-15 MG/5ML syrup Take 5 mLs by mouth 4 (four) times daily as needed for cough. 08/28/21  Yes Lynden Oxford Scales, PA-C   Family History Family History  Problem Relation Age of Onset   Healthy Mother    Healthy Father    Social History Social History   Tobacco Use   Smoking  status: Former    Types: Cigars   Smokeless tobacco: Never  Substance Use Topics   Alcohol use: Yes   Drug use: No   Allergies   Patient has no known allergies.  Review of Systems Review of Systems Pertinent findings noted in history of present illness.   Physical Exam Triage Vital Signs ED Triage Vitals  Enc Vitals Group     BP 01/11/21 0827 (!) 147/82     Pulse Rate 01/11/21 0827 72     Resp 01/11/21 0827 18     Temp 01/11/21 0827 98.3 F (36.8 C)     Temp Source 01/11/21 0827 Oral     SpO2 01/11/21 0827 98 %     Weight --      Height --      Head Circumference --      Peak Flow --      Pain Score 01/11/21 0826 5     Pain Loc --      Pain Edu? --      Excl. in [place]? --   No data found.  Updated Vital Signs BP 127/82   Pulse 92   Temp 98.7 F (37.1 C)   Resp 18   SpO2 98%   Physical Exam Vitals and nursing note reviewed.  Constitutional:      General: She is not in acute distress.    Appearance: Normal appearance. She is not ill-appearing.  HENT:     Head: Normocephalic and  atraumatic.     Salivary Glands: Right salivary gland is not diffusely enlarged or tender. Left salivary gland is not diffusely enlarged or tender.     Right Ear: Ear canal and external ear normal. No drainage. A middle ear effusion is present. There is no impacted cerumen. Tympanic membrane is bulging. Tympanic membrane is not injected or erythematous.     Left Ear: Ear canal and external ear normal. No drainage. A middle ear effusion is present. There is no impacted cerumen. Tympanic membrane is bulging. Tympanic membrane is not injected or erythematous.     Ears:     Comments: Bilateral EACs normal, both TMs bulging with clear fluid    Nose: Rhinorrhea present. No nasal deformity, septal deviation, signs of injury, nasal tenderness, mucosal edema or congestion. Rhinorrhea is clear.     Right Nostril: Occlusion present. No foreign body, epistaxis or septal hematoma.     Left Nostril:  Occlusion present. No foreign body, epistaxis or septal hematoma.     Right Turbinates: Enlarged, swollen and pale.     Left Turbinates: Enlarged, swollen and pale.     Right Sinus: No maxillary sinus tenderness or frontal sinus tenderness.     Left Sinus: No maxillary sinus tenderness or frontal sinus tenderness.     Mouth/Throat:     Lips: Pink. No lesions.     Mouth: Mucous membranes are moist. No oral lesions.     Pharynx: Oropharynx is clear. Uvula midline. No posterior oropharyngeal erythema or uvula swelling.     Tonsils: No tonsillar exudate. 0 on the right. 0 on the left.     Comments: Postnasal drip Eyes:     General: Lids are normal.        Right eye: No discharge.        Left eye: No discharge.     Extraocular Movements: Extraocular movements intact.     Conjunctiva/sclera: Conjunctivae normal.     Right eye: Right conjunctiva is not injected.     Left eye: Left conjunctiva is not injected.  Neck:     Trachea: Trachea and phonation normal.  Cardiovascular:     Rate and Rhythm: Normal rate and regular rhythm.     Pulses: Normal pulses.     Heart sounds: Normal heart sounds. No murmur heard.    No friction rub. No gallop.  Pulmonary:     Effort: Pulmonary effort is normal. No accessory muscle usage, prolonged expiration or respiratory distress.     Breath sounds: Normal breath sounds. No stridor, decreased air movement or transmitted upper airway sounds. No decreased breath sounds, wheezing, rhonchi or rales.  Chest:     Chest wall: No tenderness.  Musculoskeletal:        General: Normal range of motion.     Cervical back: Normal range of motion and neck supple. Normal range of motion.  Lymphadenopathy:     Cervical: No cervical adenopathy.  Skin:    General: Skin is warm and dry.     Findings: No erythema or rash.  Neurological:     General: No focal deficit present.     Mental Status: She is alert and oriented to person, place, and time.  Psychiatric:         Mood and Affect: Mood normal.        Behavior: Behavior normal.     Visual Acuity Right Eye Distance:   Left Eye Distance:   Bilateral Distance:    Right Eye Near:  Left Eye Near:    Bilateral Near:     UC Couse / Diagnostics / Procedures:    EKG  Radiology No results found.  Procedures Procedures (including critical care time)  UC Diagnoses / Final Clinical Impressions(s)   I have reviewed the triage vital signs and the nursing notes.  Pertinent labs & imaging results that were available during my care of the patient were reviewed by me and considered in my medical decision making (see chart for details).   Final diagnoses:  Rhinosinusitis  Nonproductive cough  Allergic disorder of respiratory tract   Patient advised that physical exam findings are concerning for untreated seasonal allergies despite lack of history of known environmental allergies.  Patient provided with 3-day course of steroids and a nighttime cough syrup.  Patient also advised to begin Allegra and Flonase to keep symptoms under control for the next 30 to 60 days.  Return precautions advised.  ED Prescriptions     Medication Sig Dispense Auth. Provider   predniSONE (DELTASONE) 20 MG tablet Take 1 tablet (20 mg total) by mouth daily with breakfast for 3 days. 3 tablet Lynden Oxford Scales, PA-C   promethazine-dextromethorphan (PROMETHAZINE-DM) 6.25-15 MG/5ML syrup Take 5 mLs by mouth 4 (four) times daily as needed for cough. 118 mL Lynden Oxford Scales, PA-C   fluticasone (FLONASE) 50 MCG/ACT nasal spray Place 1 spray into both nostrils daily. Begin by using 2 sprays in each nare daily for 3 to 5 days, then decrease to 1 spray in each nare daily. 32 mL Lynden Oxford Scales, PA-C   fexofenadine (ALLEGRA) 180 MG tablet Take 1 tablet (180 mg total) by mouth daily. 90 tablet Lynden Oxford Scales, [place]      PDMP not reviewed this encounter.  Pending results:  Labs Reviewed - No data to  display  Medications Ordered in UC: Medications - No data to display  Disposition Upon Discharge:  Condition: stable for discharge home Home: take medications as prescribed; routine discharge instructions as discussed; follow up as advised.  Patient presented with an acute illness with associated systemic symptoms and significant discomfort requiring urgent management. In my opinion, this is a condition that a prudent lay person (someone who possesses an average knowledge of health and medicine) may potentially expect to result in complications if not addressed urgently such as respiratory distress, impairment of bodily function or dysfunction of bodily organs.   Routine symptom specific, illness specific and/or disease specific instructions were discussed with the patient and/or caregiver at length.   As such, the patient has been evaluated and assessed, work-up was performed and treatment was provided in alignment with urgent care protocols and evidence based medicine.  Patient/parent/caregiver has been advised that the patient may require follow up for further testing and treatment if the symptoms continue in spite of treatment, as clinically indicated and appropriate.  If the patient was tested for COVID-19, Influenza and/or RSV, then the patient/parent/guardian was advised to isolate at home pending the results of his/her diagnostic coronavirus test and potentially longer if they're positive. I have also advised pt that if his/her COVID-19 test returns positive, it's recommended to self-isolate for at least 10 days after symptoms first appeared AND until fever-free for 24 hours without fever reducer AND other symptoms have improved or resolved. Discussed self-isolation recommendations as well as instructions for household member/close contacts as per the [facility] and [place] DHHS, and also gave patient the [facility] packet with this information.  Patient/parent/caregiver has been advised to return to the  [facility]  or PCP in 3-5 days if no better; to PCP or the Emergency Department if new signs and symptoms develop, or if the current signs or symptoms continue to change or worsen for further workup, evaluation and treatment as clinically indicated and appropriate  The patient will follow up with their current PCP if and as advised. If the patient does not currently have a PCP we will assist them in obtaining one.   The patient may need specialty follow up if the symptoms continue, in spite of conservative treatment and management, for further workup, evaluation, consultation and treatment as clinically indicated and appropriate.  Patient/parent/caregiver verbalized understanding and agreement of plan as discussed.  All questions were addressed during visit.  Please see discharge instructions below for further details of plan.  Discharge Instructions:   Discharge Instructions      Your symptoms and my physical exam findings are concerning for exacerbation of your underlying allergies.     Please see the list below for recommended medications, dosages and frequencies to provide relief of current symptoms:     Deltasone (prednisone): This is a steroid that will significantly calm your upper and lower airways, please take one tablet daily with your breakfast meal starting tomorrow morning until complete.      Allegra (fexofenadine): This is an excellent second-generation antihistamine that helps to reduce respiratory inflammatory response to environmental allergens.  This medication is not known to cause daytime sleepiness so it can be taken in the daytime.  If you find that it does make you sleepy, please feel free to take it at bedtime.   Flonase (fluticasone): This is a steroid nasal spray that you use once daily, 1 spray in each nare.  This medication does not work well if you decide to use it only used as you feel you need to, it works best used on a daily basis.  After 3 to 5 days of use, you will  notice significant reduction of the inflammation and mucus production that is currently being caused by exposure to allergens, whether seasonal or environmental.  The most common side effect of this medication is nosebleeds.  If you experience a nosebleed, please discontinue use for 1 week, then feel free to resume.  I have provided you with a prescription.     Promethazine DM: Promethazine is both a nasal decongestant and an antinausea medication that makes most patients feel fairly sleepy.  The DM is dextromethorphan, a cough suppressant found in many over-the-counter cough medications.  Please take 5 mL before bedtime to minimize your cough which will help you sleep better.  I have sent a prescription for this medication to your pharmacy.   Please follow-up within the next 7-10 days either with your primary care provider or urgent care if your symptoms do not resolve.  If you do not have a primary care provider, we will assist you in finding one.   Thank you for visiting urgent care today.  We appreciate the opportunity to participate in your care.      This office note has been dictated using Museum/gallery curator.  Unfortunately, and despite my best efforts, this method of dictation can sometimes lead to occasional typographical or grammatical errors.  I apologize in advance if this occurs.     Lynden Oxford Scales, [place] 08/30/21 [phone]

## 2021-08-28 NOTE — Discharge Instructions (Signed)
Your symptoms and my physical exam findings are concerning for exacerbation of your underlying allergies.     Please see the list below for recommended medications, dosages and frequencies to provide relief of current symptoms:     Deltasone (prednisone): This is a steroid that will significantly calm your upper and lower airways, please take one tablet daily with your breakfast meal starting tomorrow morning until complete.      Allegra (fexofenadine): This is an excellent second-generation antihistamine that helps to reduce respiratory inflammatory response to environmental allergens.  This medication is not known to cause daytime sleepiness so it can be taken in the daytime.  If you find that it does make you sleepy, please feel free to take it at bedtime.   Flonase (fluticasone): This is a steroid nasal spray that you use once daily, 1 spray in each nare.  This medication does not work well if you decide to use it only used as you feel you need to, it works best used on a daily basis.  After 3 to 5 days of use, you will notice significant reduction of the inflammation and mucus production that is currently being caused by exposure to allergens, whether seasonal or environmental.  The most common side effect of this medication is nosebleeds.  If you experience a nosebleed, please discontinue use for 1 week, then feel free to resume.  I have provided you with a prescription.     Promethazine DM: Promethazine is both a nasal decongestant and an antinausea medication that makes most patients feel fairly sleepy.  The DM is dextromethorphan, a cough suppressant found in many over-the-counter cough medications.  Please take 5 mL before bedtime to minimize your cough which will help you sleep better.  I have sent a prescription for this medication to your pharmacy.   Please follow-up within the next 7-10 days either with your primary care provider or urgent care if your symptoms do not resolve.  If you do  not have a primary care provider, we will assist you in finding one.   Thank you for visiting urgent care today.  We appreciate the opportunity to participate in your care.

## 2021-08-28 NOTE — ED Triage Notes (Signed)
Pt has had a cough for one week . Pt has used OTC with out relief.

## 2021-08-29 ENCOUNTER — Telehealth (HOSPITAL_COMMUNITY): Payer: Self-pay | Admitting: Emergency Medicine

## 2021-08-29 MED ORDER — FLUTICASONE PROPIONATE 50 MCG/ACT NA SUSP: 1.0000 | Freq: Every day | NASAL | 1 refills | Status: DC

## 2021-08-29 MED ORDER — PROMETHAZINE-DM 6.25-15 MG/5ML PO SYRP: 5.0000 mL | ORAL_SOLUTION | Freq: Four times a day (QID) | ORAL | 0 refills | Status: DC | PRN

## 2021-08-29 MED ORDER — FEXOFENADINE HCL 180 MG PO TABS: 180.0000 mg | ORAL_TABLET | Freq: Every day | ORAL | 1 refills | Status: AC

## 2021-08-29 MED ORDER — PREDNISONE 20 MG PO TABS: 20.0000 mg | ORAL_TABLET | Freq: Every day | ORAL | 0 refills | Status: AC

## 2021-08-29 NOTE — Telephone Encounter (Signed)
Patient wanted prescriptions sent to a different pharmacy

## 2022-06-02 ENCOUNTER — Emergency Department (HOSPITAL_BASED_OUTPATIENT_CLINIC_OR_DEPARTMENT_OTHER): Payer: Self-pay

## 2022-06-02 ENCOUNTER — Other Ambulatory Visit (HOSPITAL_BASED_OUTPATIENT_CLINIC_OR_DEPARTMENT_OTHER): Payer: Self-pay

## 2022-06-02 ENCOUNTER — Emergency Department (HOSPITAL_BASED_OUTPATIENT_CLINIC_OR_DEPARTMENT_OTHER)
Admission: EM | Admit: 2022-06-02 | Discharge: 2022-06-02 | Disposition: A | Discharge status: Home / Self Care | Payer: Self-pay | Attending: Emergency Medicine | Admitting: Emergency Medicine

## 2022-06-02 ENCOUNTER — Other Ambulatory Visit: Payer: Self-pay

## 2022-06-02 ENCOUNTER — Encounter (HOSPITAL_BASED_OUTPATIENT_CLINIC_OR_DEPARTMENT_OTHER): Payer: Self-pay | Admitting: Emergency Medicine

## 2022-06-02 DIAGNOSIS — S8392XA Sprain of unspecified site of left knee, initial encounter: Secondary | ICD-10-CM | POA: Insufficient documentation

## 2022-06-02 DIAGNOSIS — Y92511 Restaurant or cafe as the place of occurrence of the external cause: Secondary | ICD-10-CM | POA: Insufficient documentation

## 2022-06-02 DIAGNOSIS — M25562 Pain in left knee: Secondary | ICD-10-CM | POA: Insufficient documentation

## 2022-06-02 DIAGNOSIS — Y30XXXA Falling, jumping or pushed from a high place, undetermined intent, initial encounter: Secondary | ICD-10-CM | POA: Insufficient documentation

## 2022-06-02 MED ORDER — OXYCODONE-ACETAMINOPHEN 5-325 MG PO TABS
1.0000 | ORAL_TABLET | Freq: Once | ORAL | Status: AC
  Administered 2022-06-02: 1 via ORAL
  Filled 2022-06-02: qty 1

## 2022-06-02 NOTE — ED Triage Notes (Signed)
Pt arrived via [facility] from home. Per EMS, pt reported that she was out last night and "jumped off a bar counter top" landing on her feet. Pt c/o L knee pain. EMS reports no obvious deformity, swelling, bruising. 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  EMS VS BP 130/80 SpO2 98%  RR 16  HR 60

## 2022-06-02 NOTE — ED Notes (Signed)
Discharge instructions, medications and follow up care reviewed and explained. Pt verbalized understanding and had no further questions.   

## 2022-06-02 NOTE — Discharge Instructions (Signed)
Please use Tylenol or ibuprofen for pain.  You may use 600 mg ibuprofen every 6 hours or 1000 mg of Tylenol every 6 hours.  You may choose to alternate between the 2.  This would be most effective.  Not to exceed 4 g of Tylenol within 24 hours.  Not to exceed 3200 mg ibuprofen 24 hours.  

## 2022-06-02 NOTE — ED Provider Notes (Signed)
[facility] Provider Note   CSN: [id number] Arrival date & time: 06/02/22  0907     History  Chief Complaint  Patient presents with   Knee Pain    Kathleen Duffy is a 32 y.o. female with previous medical history significant for right ACL tear, right meniscus injury who presents with new left knee pain after jumping off of booth at a bar last night.  Patient reports that her knee buckled and she has had difficulty walking on it since then.  She rates the pain 10/10 at this time.  She has not taken anything for pain prior to arrival.  She denies any numbness, tingling but does report that it is quite painful with different movements.   Knee Pain      Home Medications Prior to Admission medications   Medication Sig Start Date End Date Taking? Authorizing Provider  fexofenadine (ALLEGRA) 180 MG tablet Take 1 tablet (180 mg total) by mouth daily. 08/29/21 02/25/22  Chase Picket, MD  fluticasone (FLONASE) 50 MCG/ACT nasal spray Place 1 spray into both nostrils daily. Begin by using 2 sprays in each nare daily for 3 to 5 days, then decrease to 1 spray in each nare daily. 08/29/21   Chase Picket, MD  promethazine-dextromethorphan (PROMETHAZINE-DM) 6.25-15 MG/5ML syrup Take 5 mLs by mouth 4 (four) times daily as needed for cough. 08/29/21   Lamptey, Myrene Galas, MD      Allergies    Patient has no known allergies.    Review of Systems   Review of Systems  All other systems reviewed and are negative.   Physical Exam Updated Vital Signs BP (!) 130/90 (BP Location: Left Arm)   Pulse 93   Temp 98.2 F (36.8 C) (Oral)   Resp 20   Ht 5\' 4"  (1.626 m)   Wt 93 kg   LMP 06/02/2022   SpO2 97%   BMI 35.19 kg/m  Physical Exam Vitals and nursing note reviewed.  Constitutional:      General: She is not in acute distress.    Appearance: Normal appearance.  HENT:     Head: Normocephalic and atraumatic.  Eyes:     General:        Right  eye: No discharge.        Left eye: No discharge.  Cardiovascular:     Rate and Rhythm: Normal rate and regular rhythm.     Pulses: Normal pulses.     Comments: DP, PT pulses 2+ on the left Pulmonary:     Effort: Pulmonary effort is normal. No respiratory distress.  Musculoskeletal:        General: No deformity.     Comments: Patient with some tenderness of the patellar ligament, pain with anterior, posterior drawer testing, but no significant laxity noted, no significant pain with varus, valgus stress, no laxity noted.  No joint effusion noted.  No significant pain to palpation of the quadriceps tendon.  She has intact range of motion to flexion extension of the left knee with some pain.  No overlying skin changes noted.  Skin:    General: Skin is warm and dry.     Capillary Refill: Capillary refill takes less than 2 seconds.  Neurological:     Mental Status: She is alert and oriented to person, place, and time.  Psychiatric:        Mood and Affect: Mood normal.        Behavior: Behavior normal.  ED Results / Procedures / Treatments   Labs (all labs ordered are listed, but only abnormal results are displayed) Labs Reviewed - No data to display  EKG None  Radiology DG Knee Left Port  Result Date: 06/02/2022 CLINICAL DATA:  Trauma, pain and swelling EXAM: PORTABLE LEFT KNEE - 1-2 VIEW COMPARISON:  None Available. FINDINGS: No fracture or dislocation is seen. There is moderate to large effusion in suprapatellar bursa. IMPRESSION: No recent fracture or dislocation is seen in left knee. Moderate to large effusion is present in suprapatellar bursa. Electronically Signed   By: Elmer Picker M.D.   On: 06/02/2022 09:58    Procedures Procedures    Medications Ordered in ED Medications  oxyCODONE-acetaminophen (PERCOCET/ROXICET) 5-325 MG per tablet 1 tablet (1 tablet Oral Given 06/02/22 1020)    ED Course/ Medical Decision Making/ A&P                             Medical  Decision Making Amount and/or Complexity of Data Reviewed Radiology: ordered.  Risk Prescription drug management.   This patient is a 32 y.o. female who presents to the ED for concern of knee pain.   Differential diagnoses prior to evaluation: Fracture, dislocation, sprain, strain, ligamentous tear, cartilage injury, versus other musculoskeletal injury, given traumatic history low clinical suspicion for septic arthritis, gout, or other nontraumatic infectious/inflammatory process.  Past Medical History / Social History / Additional history: Chart reviewed. Pertinent results include: Overall noncontributory, previous knee injury on the contralateral side involving ACL tear, meniscus injury  Physical Exam: Physical exam performed. The pertinent findings include: Tenderness to palpation of the patellar ligament, no collateral ligament laxity, some pain with anterior, posterior drawer testing.  No joint effusion noted.  No overlying skin changes noted.  Patient is neurovascularly intact throughout, intact DP, PT pulses on the left.  No sensory deficits.  Strength is intact with some pain.  Medications / Treatment: Administered Kasa in the emergency department x 1, will discharge with plan for RICE, ibuprofen, Tylenol, and orthopedic follow-up  I independently interpreted imaging including plain film radiograph of the left knee which shows no acute fracture, dislocation, but there is a moderate size suprapatellar effusion of the bursa, could suggest bursitis versus inflammatory changes related to knee sprain. I agree with the radiologist interpretation.   Disposition: After consideration of the diagnostic results and the patients response to treatment, I feel that patient is stable for discharge at this time with treatment as above, encouraged ibuprofen, Tylenol, RICE.   emergency department workup does not suggest an emergent condition requiring admission or immediate intervention beyond what  has been performed at this time. The plan is: as above. The patient is safe for discharge and has been instructed to return immediately for worsening symptoms, change in symptoms or any other concerns.  Final Clinical Impression(s) / ED Diagnoses Final diagnoses:  Sprain of left knee, unspecified ligament, initial encounter    Rx / DC Orders ED Discharge Orders     None         Dorien Chihuahua 06/02/22 1152    Charlesetta Shanks, MD 06/04/22 1004

## 2022-06-11 ENCOUNTER — Other Ambulatory Visit: Payer: Self-pay

## 2022-06-11 ENCOUNTER — Encounter (HOSPITAL_COMMUNITY): Payer: Self-pay | Admitting: Emergency Medicine

## 2022-06-11 ENCOUNTER — Ambulatory Visit (HOSPITAL_COMMUNITY)
Admission: EM | Admit: 2022-06-11 | Discharge: 2022-06-11 | Disposition: A | Discharge status: Home / Self Care | Payer: Self-pay | Attending: Family Medicine | Admitting: Family Medicine

## 2022-06-11 DIAGNOSIS — M25562 Pain in left knee: Secondary | ICD-10-CM

## 2022-06-11 MED ORDER — DICLOFENAC SODIUM 75 MG PO TBEC: 75.0000 mg | DELAYED_RELEASE_TABLET | Freq: Two times a day (BID) | ORAL | 0 refills | Status: DC

## 2022-06-11 MED ORDER — HYDROCODONE-ACETAMINOPHEN 5-325 MG PO TABS: 1.0000 | ORAL_TABLET | Freq: Four times a day (QID) | ORAL | 0 refills | Status: DC | PRN

## 2022-06-11 NOTE — Discharge Instructions (Signed)
Be aware, you have been prescribed pain medications that may cause drowsiness. While taking this medication, do not take any other medications containing acetaminophen (Tylenol). Do not combine with alcohol or recreational drugs. Please do not drive, operate heavy machinery, or take part in activities that require making important decisions while on this medication as your judgement may be clouded.  

## 2022-06-11 NOTE — ED Provider Notes (Signed)
[place]   [id number] 06/11/22 Arrival Time: 1620  ASSESSMENT & PLAN:  1. Acute pain of left knee    Negative plain films in ED. No indication to repeat here. Begin: New Prescriptions   DICLOFENAC (VOLTAREN) 75 MG EC TABLET    Take 1 tablet (75 mg total) by mouth 2 (two) times daily.   HYDROCODONE-ACETAMINOPHEN (NORCO/VICODIN) 5-325 MG TABLET    Take 1 tablet by mouth every 6 (six) hours as needed for moderate pain or severe pain.   Work/school excuse note: provided. Recommend:  Follow-up Information     Schedule an appointment as soon as possible for a visit  with [facility].   Specialty: Specialist Contact information: [address] [place] [address] [phone]                 [place] Controlled Substances Registry consulted for this patient. I feel the risk/benefit ratio today is favorable for proceeding with this prescription for a controlled substance. Medication sedation precautions given.  Reviewed expectations re: course of current medical issues. Questions answered. Outlined signs and symptoms indicating need for more acute intervention. Patient verbalized understanding. After Visit Summary given.  SUBJECTIVE: History from: patient. Kathleen Duffy is a 32 y.o. female who reports knee pain; injury on 3/17; seen in ED; note reviewed. Patient fell off a booth landing on L leg; "leg buckled"; seen in ED next day. Negative x-rays of L knee. Continued pain; does swell at times. Able to bear weight but with pain. No instability described. No extremity sensation changes or weakness. Tylenol without relief. Pain is affecting sleep.   Past Surgical History:  Procedure Laterality Date   ANTERIOR CRUCIATE LIGAMENT REPAIR Right       OBJECTIVE:  Vitals:   06/11/22 1723  BP: 136/87  Pulse: 85  Resp: 18  Temp: 97.9 F (36.6 C)  TempSrc: Oral  SpO2: 97%    General appearance: alert; no distress HEENT: [place]; AT Neck: supple with  FROM Resp: unlabored respirations Extremities: LLE: warm with well perfused appearance; poorly localized mild tenderness over left anterior knee mostly; without gross deformities; swelling: minimal; bruising: none; knee ROM: normal, with discomfort CV: brisk extremity capillary refill of LLE; 2+ DP pulse of LLE. Skin: warm and dry; no visible rashes Neurologic: normal sensation and strength of LLE Psychological: alert and cooperative; normal mood and affect   No Known Allergies  Past Medical History:  Diagnosis Date   Migraines    Social History   Socioeconomic History   Marital status: Soil scientist    Spouse name: Not on file   Number of children: Not on file   Years of education: Not on file   Highest education level: Not on file  Occupational History   Not on file  Tobacco Use   Smoking status: Former    Types: Cigars   Smokeless tobacco: Never  Vaping Use   Vaping Use: Never used  Substance and Sexual Activity   Alcohol use: Yes   Drug use: No   Sexual activity: Not Currently    Birth control/protection: None  Other Topics Concern   Not on file  Social History Narrative   Not on file   Social Determinants of Health   Financial Resource Strain: Not on file  Food Insecurity: Not on file  Transportation Needs: Not on file  Physical Activity: Not on file  Stress: Not on file  Social Connections: Not on file   Family History  Problem Relation  Age of Onset   Healthy Mother    Healthy Father    Past Surgical History:  Procedure Laterality Date   ANTERIOR CRUCIATE LIGAMENT REPAIR Right        Vanessa Kick, MD 06/11/22 [phone]

## 2022-06-11 NOTE — ED Triage Notes (Signed)
Reports started having symptoms on 3/17.  Patient fell down stairs.  Patient was on back of couch, total distance was approximately 12 feet.  The next day went to the hospital and had xrays-but was told nothing was broken.  Left knee is swollen, painful.  Patient has a knee sleeve and cane for ambulation.    Has taken tylenol

## 2022-07-16 ENCOUNTER — Ambulatory Visit (HOSPITAL_COMMUNITY)
Admission: EM | Admit: 2022-07-16 | Discharge: 2022-07-16 | Disposition: A | Discharge status: Home / Self Care | Payer: Medicaid Other | Attending: Family Medicine | Admitting: Family Medicine

## 2022-07-16 ENCOUNTER — Encounter (HOSPITAL_COMMUNITY): Payer: Self-pay | Admitting: Emergency Medicine

## 2022-07-16 ENCOUNTER — Other Ambulatory Visit: Payer: Self-pay

## 2022-07-16 DIAGNOSIS — N939 Abnormal uterine and vaginal bleeding, unspecified: Secondary | ICD-10-CM | POA: Diagnosis present

## 2022-07-16 LAB — CBC
HCT: 40 % (ref 36.0–46.0)
Hemoglobin: 13 g/dL (ref 12.0–15.0)
MCH: 28.1 pg (ref 26.0–34.0)
MCHC: 32.5 g/dL (ref 30.0–36.0)
MCV: 86.4 fL (ref 80.0–100.0)
Platelets: 197 10*3/uL (ref 150–400)
RBC: 4.63 MIL/uL (ref 3.87–5.11)
RDW: 13.5 % (ref 11.5–15.5)
WBC: 4.5 10*3/uL (ref 4.0–10.5)
nRBC: 0 % (ref 0.0–0.2)

## 2022-07-16 LAB — POCT URINALYSIS DIP (MANUAL ENTRY)
Glucose, UA: NEGATIVE mg/dL
Leukocytes, UA: NEGATIVE
Nitrite, UA: NEGATIVE
Protein Ur, POC: 100 mg/dL — AB
Spec Grav, UA: >=1.03 — AB (ref 1.010–1.025)
Urobilinogen, UA: 1 E.U./dL
pH, UA: 5.5 (ref 5.0–8.0)

## 2022-07-16 LAB — BASIC METABOLIC PANEL
Anion gap: 12 (ref 5–15)
BUN: 12 mg/dL (ref 6–20)
CO2: 20 mmol/L — ABNORMAL LOW (ref 22–32)
Calcium: 9.1 mg/dL (ref 8.9–10.3)
Chloride: 107 mmol/L (ref 98–111)
Creatinine, Ser: 1.03 mg/dL — ABNORMAL HIGH (ref 0.44–1.00)
GFR, Estimated: >60 mL/min (ref >60)
Glucose, Bld: 101 mg/dL — ABNORMAL HIGH (ref 70–99)
Potassium: 3.7 mmol/L (ref 3.5–5.1)
Sodium: 139 mmol/L (ref 135–145)

## 2022-07-16 LAB — POCT URINE PREGNANCY: Preg Test, Ur: NEGATIVE

## 2022-07-16 MED ORDER — IBUPROFEN 800 MG PO TABS: 800.0000 mg | ORAL_TABLET | Freq: Three times a day (TID) | ORAL | 0 refills | Status: DC

## 2022-07-16 MED ORDER — MEGESTROL ACETATE 40 MG PO TABS: 40.0000 mg | ORAL_TABLET | Freq: Every day | ORAL | 0 refills | Status: AC

## 2022-07-16 MED ORDER — ACETAMINOPHEN 500 MG PO TABS: 500.0000 mg | ORAL_TABLET | Freq: Four times a day (QID) | ORAL | 0 refills | Status: AC | PRN

## 2022-07-16 NOTE — ED Provider Notes (Signed)
[facility]    CSN: [id number] Arrival date & time: 07/16/22  1814      History   Chief Complaint Chief Complaint  Patient presents with   Kathleen Duffy    HPI Brentley Horrell is a 32 y.o. female.   Patient presents to clinic over concerns of lower Kathleen Duffy and abnormal vaginal bleeding.  On April 16 she started her normal period, this lasted 7 days and had a normal flow for her, no irregularities.  She normally gets her menses once monthly.  On April 2026 that she started to have vaginal bleeding again, and has had a full menstrual cycle.  She reports she has been sexually active in the past, has not been sexually active for the past few months.  Reports being recently screened for sexually transmitted infection, reports she was negative.  Has been trying heating pads for the Duffy and discomfort.  Reports lower Kathleen cramping.  Denies nausea, vomiting, diarrhea, fevers, dysuria or vaginal discharge changes.  Denies any history of uterine fibroids.   The history is provided by the patient and medical records.  Kathleen Duffy Associated symptoms: vaginal bleeding   Associated symptoms: no chest Duffy, no constipation, no cough, no diarrhea, no dysuria, no fever, no hematuria, no nausea, no shortness of breath, no sore throat and no vomiting     Past Medical History:  Diagnosis Date   Migraines     There are no problems to display for this patient.   Past Surgical History:  Procedure Laterality Date   ANTERIOR CRUCIATE LIGAMENT REPAIR Right     OB History   No obstetric history on file.      Home Medications    Prior to Admission medications   Medication Sig Start Date End Date Taking? Authorizing Provider  acetaminophen (TYLENOL) 500 MG tablet Take 1 tablet (500 mg total) by mouth every 6 (six) hours as needed. 07/16/22  Yes Rinaldo Ratel, [place] N, FNP  ibuprofen (ADVIL) 800 MG tablet Take 1 tablet (800 mg total) by mouth 3 (three) times daily. 07/16/22   Yes Rinaldo Ratel, [place] N, FNP  megestrol (MEGACE) 40 MG tablet Take 1 tablet (40 mg total) by mouth daily for 7 days. 07/16/22 07/23/22 Yes Rinaldo Ratel, [place], FNP  fexofenadine (ALLEGRA) 180 MG tablet Take 1 tablet (180 mg total) by mouth daily. 08/29/21 02/25/22  LampteyBritta Mccreedy, MD    Family History Family History  Problem Relation Age of Onset   Healthy Mother    Healthy Father     Social History Social History   Tobacco Use   Smoking status: Former    Types: Cigars   Smokeless tobacco: Never  Vaping Use   Vaping Use: Never used  Substance Use Topics   Alcohol use: Yes   Drug use: No     Allergies   Patient has no known allergies.   Review of Systems Review of Systems  Constitutional:  Negative for fever.  HENT:  Negative for sore throat.   Respiratory:  Negative for cough and shortness of breath.   Cardiovascular:  Negative for chest Duffy.  Gastrointestinal:  Positive for Kathleen Duffy. Negative for constipation, diarrhea, nausea and vomiting.  Genitourinary:  Positive for menstrual problem and vaginal bleeding. Negative for dysuria and hematuria.  Musculoskeletal:  Negative for back Duffy.     Physical Exam Triage Vital Signs ED Triage Vitals  Enc Vitals Group     BP 07/16/22 1850 128/85     Pulse Rate 07/16/22  1850 77     Resp 07/16/22 1850 18     Temp 07/16/22 1850 98.4 F (36.9 C)     Temp Source 07/16/22 1850 Oral     SpO2 07/16/22 1850 98 %     Weight --      Height --      Head Circumference --      Peak Flow --      Duffy Score 07/16/22 1847 10     Duffy Loc --      Duffy Edu? --      Excl. in GC? --    No data found.  Updated Vital Signs BP 128/85 (BP Location: Right Arm)   Pulse 77   Temp 98.4 F (36.9 C) (Oral)   Resp 18   LMP 07/11/2022   SpO2 98%   Visual Acuity Right Eye Distance:   Left Eye Distance:   Bilateral Distance:    Right Eye Near:   Left Eye Near:    Bilateral Near:     Physical Exam Vitals and nursing note  reviewed.  Constitutional:      General: She is not in acute distress.    Appearance: She is well-developed.  HENT:     Head: Normocephalic and atraumatic.     Mouth/Throat:     Mouth: Mucous membranes are moist.  Eyes:     Conjunctiva/sclera: Conjunctivae normal.  Cardiovascular:     Rate and Rhythm: Normal rate and regular rhythm.     Heart sounds: Normal heart sounds. No murmur heard. Pulmonary:     Effort: Pulmonary effort is normal. No respiratory distress.     Breath sounds: Normal breath sounds.  Kathleen:     General: Abdomen is flat. Bowel sounds are normal.     Palpations: Abdomen is soft.     Tenderness: There is no Kathleen tenderness. There is no guarding or rebound.  Musculoskeletal:        General: No swelling.     Cervical back: Neck supple.  Skin:    General: Skin is warm and dry.     Capillary Refill: Capillary refill takes less than 2 seconds.  Neurological:     General: No focal deficit present.     Mental Status: She is alert and oriented to person, place, and time.  Psychiatric:        Mood and Affect: Mood normal.      UC Treatments / Results  Labs (all labs ordered are listed, but only abnormal results are displayed) Labs Reviewed  POCT URINALYSIS DIP (MANUAL ENTRY) - Abnormal; Notable for the following components:      Result Value   Color, UA red (*)    Clarity, UA cloudy (*)    Bilirubin, UA small (*)    Ketones, POC UA trace (5) (*)    Spec Grav, UA >=1.030 (*)    Blood, UA large (*)    Protein Ur, POC =100 (*)    All other components within normal limits  CBC  BASIC METABOLIC PANEL  POCT URINE PREGNANCY    EKG   Radiology No results found.  Procedures Procedures (including critical care time)  Medications Ordered in UC Medications - No data to display  Initial Impression / Assessment and Plan / UC Course  I have reviewed the triage vital signs and the nursing notes.  Pertinent labs & imaging results that were  available during my care of the patient were reviewed by me and considered in my  medical decision making (see chart for details).  Vitals and triage reviewed, patient is hemodynamically stable.  Abdomen is soft and nontender with active bowel sounds.  Urinalysis positive for blood, bilirubin, ketones and protein.  Negative for nitrates and leukocytes.  Urine pregnancy is negative.  Will check CBC and BMP for electrolyte and hemoglobin abnormalities due to prolonged bleeding.  Advised to follow-up with GYN for further evaluation and potential transvaginal ultrasound.  Given Megace to stop the bleeding.  Advised symptomatic management with Tylenol and ibuprofen.  Patient verbalized understanding, return follow-up precautions reviewed, no questions at this time.    Final Clinical Impressions(s) / UC Diagnoses   Final diagnoses:  Abnormal uterine bleeding (AUB)     Discharge Instructions      Please follow-up with your GYN as soon as possible, they will be the ones to do a physical examination and potentially schedule a vaginal ultrasound. To help stop your vaginal bleeding please start the Megace and take once daily.  You can alternate between Tylenol and ibuprofen every 4-6 hours for Duffy and discomfort.  We have checked your labs and electrolytes today, we will call if anything is emergent requiring immediate follow-up.      ED Prescriptions     Medication Sig Dispense Auth. Provider   megestrol (MEGACE) 40 MG tablet Take 1 tablet (40 mg total) by mouth daily for 7 days. 7 tablet Rinaldo Ratel, [place] N, [place]   acetaminophen (TYLENOL) 500 MG tablet Take 1 tablet (500 mg total) by mouth every 6 (six) hours as needed. 30 tablet Rinaldo Ratel, [place], [place]   ibuprofen (ADVIL) 800 MG tablet Take 1 tablet (800 mg total) by mouth 3 (three) times daily. 21 tablet Kaliana Albino, [place] N, [place]      PDMP not reviewed this encounter.   Japji Kok, [place] N, [place] 07/16/22 1929

## 2022-07-16 NOTE — ED Triage Notes (Signed)
07/01/2022 started her normal period.  Lasted for 7 days, and that was normal.  Friday 07/11/2022 started bleeding again.  This is not normal for patient, abdominal cramping is worse.   Denies ever being on birth control.     Has taken tylenol for symptoms

## 2022-07-16 NOTE — ED Notes (Signed)
Reviewed work note 

## 2022-07-16 NOTE — Discharge Instructions (Addendum)
Please follow-up with your GYN as soon as possible, they will be the ones to do a physical examination and potentially schedule a vaginal ultrasound. To help stop your vaginal bleeding please start the Megace and take once daily.  You can alternate between Tylenol and ibuprofen every 4-6 hours for pain and discomfort.  We have checked your labs and electrolytes today, we will call if anything is emergent requiring immediate follow-up.

## 2022-11-03 NOTE — Therapy (Signed)
OUTPATIENT PHYSICAL THERAPY LOWER EXTREMITY EVALUATION   Patient Name: Kathleen Duffy MRN: [id number] DOB:Jul 20, 1990, 32 y.o., female Today's Date: 11/04/2022  END OF SESSION:  PT End of Session - 11/04/22 1006     Visit Number 1    Number of Visits 25    Date for PT Re-Evaluation 01/31/23    Authorization Type [employer]    Authorization Time Period requesting auth    PT Start Time 0932    PT Stop Time 1006    PT Time Calculation (min) 34 min    Activity Tolerance Patient tolerated treatment well    Behavior During Therapy WFL for tasks assessed/performed             Past Medical History:  Diagnosis Date   Migraines    Past Surgical History:  Procedure Laterality Date   ANTERIOR CRUCIATE LIGAMENT REPAIR Right    There are no problems to display for this patient.   PCP: none   REFERRING PROVIDER: Armida Sans, PA-C  REFERRING DIAG: s/p ACL reconstruction 10/16/2022  THERAPY DIAG:  Acute pain of left knee  Muscle weakness (generalized)  Other abnormalities of gait and mobility  Localized edema  Rationale for Evaluation and Treatment: Rehabilitation  ONSET DATE: 10/16/22  SUBJECTIVE:   SUBJECTIVE STATEMENT: Patient underwent Lt ACLR on 10/16/22 after sustaining ACL tear in March of 2024 when she jumped out of a booth. She has previously torn the Rt ACL and underwent ACLR in 2019 for this. Patient had post-op appointment on 10/30/22 and was told to wear her brace "moderately" as it can be an "enabler." She has been using a SPC for ambulation for the past week. She has not been completing any exercises. She reports the pain is "tolerable," but sleep is the most uncomfortable thing right now.   PERTINENT HISTORY: Lt ACLR 10/16/22 PAIN:  Are you having pain? Yes: NPRS scale: 3 currently (9 at worst)/10 Pain location: Lt anterior knee  Pain description: ache  Aggravating factors: sleep positioning,prolonged walking Relieving factors:  elevation  PRECAUTIONS: Knee and Fall   WEIGHT BEARING RESTRICTIONS: Yes WBAT  FALLS:  Has patient fallen in last 6 months? Yes. 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, push/pull cart)   PLOF: Independent  PATIENT GOALS: "go back to work."  NEXT MD VISIT: September 2024   OBJECTIVE:   DIAGNOSTIC FINDINGS: none on file   PATIENT SURVEYS:  FOTO 30% function to 61% predicted   COGNITION: Overall cognitive status: Within functional limits for tasks assessed     SENSATION: Not tested  EDEMA:  Mild swelling about Lt knee   MUSCLE LENGTH: Not assessed   POSTURE: maintains Lt knee in flexion in standing   PALPATION: Diffuse tenderness about Lt anterior knee   LOWER EXTREMITY ROM:  Active ROM Right eval Left eval  Hip flexion    Hip extension    Hip abduction    Hip adduction    Hip internal rotation    Hip external rotation    Knee flexion  65  Knee extension  Lacking 6  Ankle dorsiflexion    Ankle plantarflexion    Ankle inversion    Ankle eversion     (  Blank rows = not tested)  LOWER EXTREMITY MMT:  MMT Right eval Left eval  Hip flexion  4  Hip extension    Hip abduction    Hip adduction    Hip internal rotation    Hip external rotation    Knee flexion    Knee extension    Ankle dorsiflexion    Ankle plantarflexion    Ankle inversion    Ankle eversion     (Blank rows = not tested); Lt knee MMT deferred due to post-op acuity   LOWER EXTREMITY SPECIAL TESTS:  N/A  FUNCTIONAL TESTS:  TUG 17.3 seconds with SPC  GAIT: Distance walked: 10 ft  Assistive device utilized: Single point cane Level of assistance: Modified  independence Comments: limited knee flexion/extension during stance and swing on LLE, limited push-off LLE, slow gait speed.    [facility] Adult PT Treatment:                                                DATE: 11/04/22 Therapeutic Exercise: Demonstrated and issue initial HEP.    Therapeutic Activity: Education on assessment findings that will be addressed throughout duration of POC.    Self Care: Discussed modalities for pain control Importance of using brace    PATIENT EDUCATION:  Education details: see treatment  Person educated: Patient Education method: Explanation, Demonstration, Tactile cues, Verbal cues, and Handouts Education comprehension: verbalized understanding, returned demonstration, verbal cues required, tactile cues required, and needs further education  HOME EXERCISE PROGRAM: Access Code: [email or web address] URL: [email or web address] Date: 11/04/2022 Prepared by: Letitia Libra  Exercises - Supine Heel Slide  - 2 x daily - 7 x weekly - 1 sets - 10 reps - 5 sec  hold - Supine Quad Set  - 2 x daily - 7 x weekly - 2 sets - 10 reps - 5 sec  hold - Long Sitting Calf Stretch with Strap  - 2 x daily - 7 x weekly - 3 sets - 30 sec  hold - Seated Hamstring Stretch  - 2 x daily - 7 x weekly - 3 sets - 30 sec  hold - Seated Knee Flexion AAROM  - 2 x daily - 7 x weekly - 1 sets - 10 reps - 5 sec  hold  ASSESSMENT:  CLINICAL IMPRESSION: Patient is a 32 y.o. female who was seen today for physical therapy evaluation and treatment for s/p Lt ACL reconstruction with FGL graft-link on 10/16/22. She demonstrates ROM, strength, gait and balance deficits that are consistent with her recent post-operative status. She was encouraged to continue to wear her brace due to poor quad activation with patient verbalizing understanding. She will benefit from skilled PT to address the above stated deficits in order to return to optimal function.   OBJECTIVE IMPAIRMENTS: Abnormal gait,  decreased activity tolerance, decreased balance, decreased endurance, decreased knowledge of condition, difficulty walking, decreased ROM, decreased strength, improper body mechanics, postural dysfunction, and pain.   ACTIVITY LIMITATIONS: carrying, lifting, bending, standing, squatting, stairs, transfers, and locomotion level  PARTICIPATION LIMITATIONS: meal prep, cleaning, laundry, driving, shopping, community activity, and occupation  PERSONAL FACTORS: Age, Fitness, Profession, and Time since onset of injury/illness/exacerbation are also affecting patient's functional outcome.   REHAB POTENTIAL: Good  CLINICAL DECISION MAKING: Stable/uncomplicated  EVALUATION COMPLEXITY: Low   GOALS: Goals reviewed with patient? Yes  SHORT TERM GOALS: Target  date: 12/15/2022   Patient will be independent and compliant with initial HEP.   Baseline: issued at eval.  Goal status: INITIAL  2.  Patient will perform SLR without quad lag to improve gait stability.  Baseline: see above Goal status: INITIAL  3.  Patient will demonstrate at least 90 degrees of Lt knee flexion AROM to improve ability to complete sit to stand.   Baseline: see above Goal status: INITIAL  4.  Patient will demonstrate full Lt knee extension AROM to improve gait mechanics.  Baseline: see above Goal status: INITIAL  5. Patient will report pain at worst rated as </=5/10 to reduce current functional limitations.   Baseline: see above  Goal Status: INITIAL    LONG TERM GOALS: Target date: 01/31/2023    Patient will demonstrate 5/5 Lt knee strength to improve stability with stair and curb negotiation.  Baseline: deferred due to post-op acuity Goal status: INITIAL  2.  Patient will demonstrate at least 120 degrees of Lt knee flexion AROM to improve ability to complete bending activity.  Baseline: see above Goal status: INITIAL  3.  Patient will demonstrate normalized and pain free squat mechanics to improve ability  to complete lifting activity at work.  Baseline: unable Goal status: INITIAL  4.  Patient will ambulate community distances without AD with </=2/10 pain Baseline: SPC, increased pain with prolonged walking.  Goal status: INITIAL  5.  Patient will be able to ascend/descend stairs with reciprocal pattern without UE support Baseline: step to pattern, utilizes handrail and SPC Goal status: INITIAL  6.  Patient will score at least 61% on FOTO to signify clinically meaningful improvement in functional abilities.   Baseline: 30 Goal status: INITIAL   PLAN:  PT FREQUENCY: 2x/week  PT DURATION: 12 weeks  PLANNED INTERVENTIONS: Therapeutic exercises, Therapeutic activity, Neuromuscular re-education, Balance training, Gait training, Patient/Family education, Self Care, Joint mobilization, Dry Needling, Electrical stimulation, Cryotherapy, Moist heat, Vasopneumatic device, Manual therapy, and Re-evaluation  PLAN FOR NEXT SESSION: review and progress HEP; quad strengthening, gait training. Knee ROM  Letitia Libra, PT, DPT, ATC 11/04/22 1:42 PM  Check all possible CPT codes: 86578 - PT Re-evaluation, 97110- Therapeutic Exercise, [phone]- Neuro Re-education, [phone] - Gait Training, [phone] - Manual Therapy, 97530 - Therapeutic Activities, [phone] - Self Care, [phone] - Electrical stimulation (Manual), and [phone] - Vaso    Check all conditions that are expected to impact treatment: {Conditions expected to impact treatment:None of these apply   If treatment provided at initial evaluation, no treatment charged due to lack of authorization.

## 2022-11-04 ENCOUNTER — Ambulatory Visit: Payer: Commercial Managed Care - HMO | Attending: Surgical

## 2022-11-04 ENCOUNTER — Other Ambulatory Visit: Payer: Self-pay

## 2022-11-04 DIAGNOSIS — R600 Localized edema: Secondary | ICD-10-CM | POA: Insufficient documentation

## 2022-11-04 DIAGNOSIS — M25562 Pain in left knee: Secondary | ICD-10-CM | POA: Insufficient documentation

## 2022-11-04 DIAGNOSIS — M6281 Muscle weakness (generalized): Secondary | ICD-10-CM | POA: Insufficient documentation

## 2022-11-04 DIAGNOSIS — R2689 Other abnormalities of gait and mobility: Secondary | ICD-10-CM | POA: Diagnosis present

## 2022-11-06 ENCOUNTER — Encounter: Payer: Self-pay | Admitting: Physical Therapy

## 2022-11-06 ENCOUNTER — Ambulatory Visit: Payer: Commercial Managed Care - HMO | Admitting: Physical Therapy

## 2022-11-06 DIAGNOSIS — M25562 Pain in left knee: Secondary | ICD-10-CM | POA: Diagnosis not present

## 2022-11-06 DIAGNOSIS — M6281 Muscle weakness (generalized): Secondary | ICD-10-CM

## 2022-11-06 DIAGNOSIS — R2689 Other abnormalities of gait and mobility: Secondary | ICD-10-CM

## 2022-11-06 DIAGNOSIS — R600 Localized edema: Secondary | ICD-10-CM

## 2022-11-06 NOTE — Therapy (Signed)
OUTPATIENT PHYSICAL THERAPY LOWER EXTREMITY EVALUATION   Patient Name: Kathleen Duffy MRN: [id number] DOB:Sep 13, 1990, 31 y.o., female Today's Date: 11/06/2022  END OF SESSION:  PT End of Session - 11/06/22 1157     Visit Number 2    Number of Visits 25    Date for PT Re-Evaluation 01/31/23    Authorization Type [employer]/healthy blue    Authorization Time Period 8/20-11/17/2024    Authorization - Visit Number 1    Authorization - Number of Visits 12    PT Start Time 1157   pt arrived late   PT Stop Time 1248    PT Time Calculation (min) 51 min              Past Medical History:  Diagnosis Date   Migraines    Past Surgical History:  Procedure Laterality Date   ANTERIOR CRUCIATE LIGAMENT REPAIR Right    There are no problems to display for this patient.   PCP: none   REFERRING PROVIDER: Armida Sans, PA-C  REFERRING DIAG: s/p ACL reconstruction 10/16/2022  THERAPY DIAG:  Acute pain of left knee  Muscle weakness (generalized)  Other abnormalities of gait and mobility  Localized edema  Rationale for Evaluation and Treatment: Rehabilitation  ONSET DATE: 10/16/22  SUBJECTIVE:   SUBJECTIVE STATEMENT: "I am doing okay, exercises are going okay at home."  PERTINENT HISTORY: Lt ACLR 10/16/22 PAIN:  Are you having pain? Yes: NPRS scale: 0/10 currently  Pain location: Lt anterior knee  Pain description: ache  Aggravating factors: sleep positioning,prolonged walking Relieving factors: elevation  PRECAUTIONS: Knee and Fall   WEIGHT BEARING RESTRICTIONS: Yes WBAT  FALLS:  Has patient fallen in last 6 months? Yes. 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,  push/pull cart)   PLOF: Independent  PATIENT GOALS: "go back to work."  NEXT MD VISIT: September 2024   OBJECTIVE:   DIAGNOSTIC FINDINGS: none on file   PATIENT SURVEYS:  FOTO 30% function to 61% predicted   COGNITION: Overall cognitive status: Within functional limits for tasks assessed     SENSATION: Not tested  EDEMA:  Mild swelling about Lt knee   MUSCLE LENGTH: Not assessed   POSTURE: maintains Lt knee in flexion in standing   PALPATION: Diffuse tenderness about Lt anterior knee   LOWER EXTREMITY ROM:  Active ROM Right eval Left eval Left 11/06/2022  Hip flexion     Hip extension     Hip abduction     Hip adduction     Hip internal rotation     Hip external rotation     Knee flexion  65 87  Knee extension  Lacking 6 16  Ankle dorsiflexion     Ankle plantarflexion     Ankle inversion     Ankle eversion      (Blank rows = not tested)  LOWER EXTREMITY MMT:  MMT Right eval Left eval  Hip flexion  4  Hip extension    Hip abduction    Hip adduction    Hip internal rotation    Hip external rotation    Knee flexion    Knee extension    Ankle dorsiflexion    Ankle plantarflexion  Ankle inversion    Ankle eversion     (Blank rows = not tested); Lt knee MMT deferred due to post-op acuity   LOWER EXTREMITY SPECIAL TESTS:  N/A  FUNCTIONAL TESTS:  TUG 17.3 seconds with SPC  GAIT: Distance walked: 10 ft  Assistive device utilized: Single point cane Level of assistance: Modified independence Comments: limited knee flexion/extension during stance and swing on LLE, limited push-off LLE, slow gait speed.   [facility] Adult PT Treatment:                                                DATE: 11/06/2022 Therapeutic Exercise: Supine hamstring stretch 2 x 30 sec Quad set 1 x 10 holding 10 sec  SLR 1 x 5 quad lag noted going 1/2 range Recumbent bike L1 x 5 min backward full revolutions  Reviewed HEP Manual Therapy: Tibiofemoral PA/ AP mobs grade  III Neuromuscular re-ed: Gait training with SPC focusing on heel strike/ toe off Modalities: Vasopnuematic compression x 15 min    [facility] Adult PT Treatment:                                                DATE: 11/04/22 Therapeutic Exercise: Demonstrated and issue initial HEP.    Therapeutic Activity: Education on assessment findings that will be addressed throughout duration of POC.    Self Care: Discussed modalities for pain control Importance of using brace    PATIENT EDUCATION:  Education details: see treatment  Person educated: Patient Education method: Explanation, Demonstration, Tactile cues, Verbal cues, and Handouts Education comprehension: verbalized understanding, returned demonstration, verbal cues required, tactile cues required, and needs further education  HOME EXERCISE PROGRAM: Access Code: [email or web address] URL: [email or web address] Date: 11/04/2022 Prepared by: Letitia Libra  Exercises - Supine Heel Slide  - 2 x daily - 7 x weekly - 1 sets - 10 reps - 5 sec  hold - Supine Quad Set  - 2 x daily - 7 x weekly - 2 sets - 10 reps - 5 sec  hold - Long Sitting Calf Stretch with Strap  - 2 x daily - 7 x weekly - 3 sets - 30 sec  hold - Seated Hamstring Stretch  - 2 x daily - 7 x weekly - 3 sets - 30 sec  hold - Seated Knee Flexion AAROM  - 2 x daily - 7 x weekly - 1 sets - 10 reps - 5 sec  hold  ASSESSMENT:  CLINICAL IMPRESSION: 11/06/2022 Pt arrives to her follow up session noting consistency with her HEP. She demonstrates improvement with exteension ROM with reduction in knee flexion compared to evaluation. Limited session due to pt arriving late. Worked on ROM, and quad activation which she did well with cues. Utilize vaso end of session to calm down pain and swelling.    EVALUATION: Patient is a 32 y.o. female who was seen today for physical therapy evaluation and treatment for s/p Lt ACL reconstruction with FGL graft-link on 10/16/22. She demonstrates  ROM, strength, gait and balance deficits that are consistent with her recent post-operative status. She was encouraged to continue to wear her brace due to poor quad activation with patient verbalizing understanding. She will benefit from skilled PT to  address the above stated deficits in order to return to optimal function.   OBJECTIVE IMPAIRMENTS: Abnormal gait, decreased activity tolerance, decreased balance, decreased endurance, decreased knowledge of condition, difficulty walking, decreased ROM, decreased strength, improper body mechanics, postural dysfunction, and pain.   ACTIVITY LIMITATIONS: carrying, lifting, bending, standing, squatting, stairs, transfers, and locomotion level  PARTICIPATION LIMITATIONS: meal prep, cleaning, laundry, driving, shopping, community activity, and occupation  PERSONAL FACTORS: Age, Fitness, Profession, and Time since onset of injury/illness/exacerbation are also affecting patient's functional outcome.   REHAB POTENTIAL: Good  CLINICAL DECISION MAKING: Stable/uncomplicated  EVALUATION COMPLEXITY: Low   GOALS: Goals reviewed with patient? Yes  SHORT TERM GOALS: Target date: 12/15/2022   Patient will be independent and compliant with initial HEP.   Baseline: issued at eval.  Goal status: INITIAL  2.  Patient will perform SLR without quad lag to improve gait stability.  Baseline: see above Goal status: INITIAL  3.  Patient will demonstrate at least 90 degrees of Lt knee flexion AROM to improve ability to complete sit to stand.   Baseline: see above Goal status: INITIAL  4.  Patient will demonstrate full Lt knee extension AROM to improve gait mechanics.  Baseline: see above Goal status: INITIAL  5. Patient will report pain at worst rated as </=5/10 to reduce current functional limitations.   Baseline: see above  Goal Status: INITIAL    LONG TERM GOALS: Target date: 01/31/2023    Patient will demonstrate 5/5 Lt knee strength to improve  stability with stair and curb negotiation.  Baseline: deferred due to post-op acuity Goal status: INITIAL  2.  Patient will demonstrate at least 120 degrees of Lt knee flexion AROM to improve ability to complete bending activity.  Baseline: see above Goal status: INITIAL  3.  Patient will demonstrate normalized and pain free squat mechanics to improve ability to complete lifting activity at work.  Baseline: unable Goal status: INITIAL  4.  Patient will ambulate community distances without AD with </=2/10 pain Baseline: SPC, increased pain with prolonged walking.  Goal status: INITIAL  5.  Patient will be able to ascend/descend stairs with reciprocal pattern without UE support Baseline: step to pattern, utilizes handrail and SPC Goal status: INITIAL  6.  Patient will score at least 61% on FOTO to signify clinically meaningful improvement in functional abilities.   Baseline: 30 Goal status: INITIAL   PLAN:  PT FREQUENCY: 2x/week  PT DURATION: 12 weeks  PLANNED INTERVENTIONS: Therapeutic exercises, Therapeutic activity, Neuromuscular re-education, Balance training, Gait training, Patient/Family education, Self Care, Joint mobilization, Dry Needling, Electrical stimulation, Cryotherapy, Moist heat, Vasopneumatic device, Manual therapy, and Re-evaluation  PLAN FOR NEXT SESSION: review and progress HEP; quad strengthening, gait training. Knee ROM  Ezra Marquess PT, DPT, LAT, ATC  11/06/22  12:57 PM

## 2022-11-10 ENCOUNTER — Ambulatory Visit: Payer: Commercial Managed Care - HMO | Admitting: Physical Therapy

## 2022-11-10 DIAGNOSIS — M25562 Pain in left knee: Secondary | ICD-10-CM

## 2022-11-10 DIAGNOSIS — R600 Localized edema: Secondary | ICD-10-CM

## 2022-11-10 DIAGNOSIS — M6281 Muscle weakness (generalized): Secondary | ICD-10-CM

## 2022-11-10 DIAGNOSIS — R2689 Other abnormalities of gait and mobility: Secondary | ICD-10-CM

## 2022-11-10 NOTE — Therapy (Signed)
OUTPATIENT PHYSICAL THERAPY LOWER EXTREMITY TREATMENT   Patient Name: Kathleen Duffy MRN: [id number] DOB:04-Jun-1990, 32 y.o., female Today's Date: 11/10/2022  END OF SESSION:  PT End of Session - 11/10/22 0940     Visit Number 3    Number of Visits 25    Date for PT Re-Evaluation 01/31/23    Authorization Type [employer]/healthy blue    Authorization Time Period 8/20-11/17/2024    Authorization - Visit Number 2    Authorization - Number of Visits 12    PT Start Time 0936    PT Stop Time 1025    PT Time Calculation (min) 49 min              Past Medical History:  Diagnosis Date   Migraines    Past Surgical History:  Procedure Laterality Date   ANTERIOR CRUCIATE LIGAMENT REPAIR Right    There are no problems to display for this patient.   PCP: none   REFERRING PROVIDER: Armida Sans, PA-C  REFERRING DIAG: s/p ACL reconstruction 10/16/2022  THERAPY DIAG:  Acute pain of left knee  Muscle weakness (generalized)  Other abnormalities of gait and mobility  Localized edema  Rationale for Evaluation and Treatment: Rehabilitation  ONSET DATE: 10/16/22  SUBJECTIVE:   SUBJECTIVE STATEMENT: "I am doing the exercises. No pain. Hurts a little when stretching.   PERTINENT HISTORY: Lt ACLR 10/16/22 PAIN:  Are you having pain? Yes: NPRS scale: 0/10 currently  Pain location: Lt anterior knee  Pain description: ache  Aggravating factors: sleep positioning,prolonged walking Relieving factors: elevation  PRECAUTIONS: Knee and Fall   WEIGHT BEARING RESTRICTIONS: Yes WBAT  FALLS:  Has patient fallen in last 6 months? Yes. 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,  push/pull cart)   PLOF: Independent  PATIENT GOALS: "go back to work."  NEXT MD VISIT: September 2024   OBJECTIVE:   DIAGNOSTIC FINDINGS: none on file   PATIENT SURVEYS:  FOTO 30% function to 61% predicted   COGNITION: Overall cognitive status: Within functional limits for tasks assessed     SENSATION: Not tested  EDEMA:  Mild swelling about Lt knee   MUSCLE LENGTH: Not assessed   POSTURE: maintains Lt knee in flexion in standing   PALPATION: Diffuse tenderness about Lt anterior knee   LOWER EXTREMITY ROM:  Active ROM Right eval Left eval Left 11/06/2022 Left  11/10/22  Hip flexion      Hip extension      Hip abduction      Hip adduction      Hip internal rotation      Hip external rotation      Knee flexion  65 87 102  Knee extension  Lacking 6 16   Ankle dorsiflexion      Ankle plantarflexion      Ankle inversion      Ankle eversion       (Blank rows = not tested)  LOWER EXTREMITY MMT:  MMT Right eval Left eval  Hip flexion  4  Hip extension    Hip abduction    Hip adduction    Hip internal rotation    Hip external rotation    Knee flexion  Knee extension    Ankle dorsiflexion    Ankle plantarflexion    Ankle inversion    Ankle eversion     (Blank rows = not tested); Lt knee MMT deferred due to post-op acuity   LOWER EXTREMITY SPECIAL TESTS:  N/A  FUNCTIONAL TESTS:  TUG 17.3 seconds with SPC  GAIT: Distance walked: 10 ft  Assistive device utilized: Single point cane Level of assistance: Modified independence Comments: limited knee flexion/extension during stance and swing on LLE, limited push-off LLE, slow gait speed.   [facility] Adult PT Treatment:                                                DATE: 11/10/22 Therapeutic Exercise: Rec bike full backward, then forward ROM.  Seated calf stretch with strap  Supine h/s stretch with strap  QS 5 sec x 10  SLR with initial QS to fatigue- quad lag Heel slides AROM, AAROM to 102 deg Side  hip abduction 10 x 2   Modalities: Vasopnuematic low compression x 15 min , coldest    [facility] Adult PT Treatment:                                                DATE: 11/06/2022 Therapeutic Exercise: Supine hamstring stretch 2 x 30 sec Quad set 1 x 10 holding 10 sec  SLR 1 x 5 quad lag noted going 1/2 range Recumbent bike L1 x 5 min backward full revolutions  Reviewed HEP Manual Therapy: Tibiofemoral PA/ AP mobs grade III Neuromuscular re-ed: Gait training with SPC focusing on heel strike/ toe off Modalities: Vasopnuematic compression x 15 min    [facility] Adult PT Treatment:                                                DATE: 11/04/22 Therapeutic Exercise: Demonstrated and issue initial HEP.    Therapeutic Activity: Education on assessment findings that will be addressed throughout duration of POC.    Self Care: Discussed modalities for pain control Importance of using brace    PATIENT EDUCATION:  Education details: see treatment  Person educated: Patient Education method: Explanation, Demonstration, Tactile cues, Verbal cues, and Handouts Education comprehension: verbalized understanding, returned demonstration, verbal cues required, tactile cues required, and needs further education  HOME EXERCISE PROGRAM: Access Code: [email or web address] URL: [email or web address] Date: 11/04/2022 Prepared by: Letitia Libra  Exercises - Supine Heel Slide  - 2 x daily - 7 x weekly - 1 sets - 10 reps - 5 sec  hold - Supine Quad Set  - 2 x daily - 7 x weekly - 2 sets - 10 reps - 5 sec  hold - Long Sitting Calf Stretch with Strap  - 2 x daily - 7 x weekly - 3 sets - 30 sec  hold - Seated Hamstring Stretch  - 2 x daily - 7 x weekly - 3 sets - 30 sec  hold - Seated Knee Flexion AAROM  - 2 x daily - 7 x weekly - 1 sets - 10 reps - 5 sec  hold  ASSESSMENT:  CLINICAL IMPRESSION: 11/10/2022 Pt reports no pain at start of session and compliance with HEP. She achieved 102 degrees knee  flexion and demonstrates a quad lag. She will benefit from skilled PT to address the above stated deficits in order to return to optimal function.    EVALUATION: Patient is a 32 y.o. female who was seen today for physical therapy evaluation and treatment for s/p Lt ACL reconstruction with FGL graft-link on 10/16/22. She demonstrates ROM, strength, gait and balance deficits that are consistent with her recent post-operative status. She was encouraged to continue to wear her brace due to poor quad activation with patient verbalizing understanding. She will benefit from skilled PT to address the above stated deficits in order to return to optimal function.   OBJECTIVE IMPAIRMENTS: Abnormal gait, decreased activity tolerance, decreased balance, decreased endurance, decreased knowledge of condition, difficulty walking, decreased ROM, decreased strength, improper body mechanics, postural dysfunction, and pain.   ACTIVITY LIMITATIONS: carrying, lifting, bending, standing, squatting, stairs, transfers, and locomotion level  PARTICIPATION LIMITATIONS: meal prep, cleaning, laundry, driving, shopping, community activity, and occupation  PERSONAL FACTORS: Age, Fitness, Profession, and Time since onset of injury/illness/exacerbation are also affecting patient's functional outcome.   REHAB POTENTIAL: Good  CLINICAL DECISION MAKING: Stable/uncomplicated  EVALUATION COMPLEXITY: Low   GOALS: Goals reviewed with patient? Yes  SHORT TERM GOALS: Target date: 12/15/2022   Patient will be independent and compliant with initial HEP.   Baseline: issued at eval.  Goal status: INITIAL  2.  Patient will perform SLR without quad lag to improve gait stability.  Baseline: see above Goal status: INITIAL  3.  Patient will demonstrate at least 90 degrees of Lt knee flexion AROM to improve ability to complete sit to stand.   Baseline: see above Goal status: INITIAL  4.  Patient will demonstrate full Lt knee  extension AROM to improve gait mechanics.  Baseline: see above Goal status: INITIAL  5. Patient will report pain at worst rated as </=5/10 to reduce current functional limitations.   Baseline: see above  Goal Status: INITIAL    LONG TERM GOALS: Target date: 01/31/2023    Patient will demonstrate 5/5 Lt knee strength to improve stability with stair and curb negotiation.  Baseline: deferred due to post-op acuity Goal status: INITIAL  2.  Patient will demonstrate at least 120 degrees of Lt knee flexion AROM to improve ability to complete bending activity.  Baseline: see above Goal status: INITIAL  3.  Patient will demonstrate normalized and pain free squat mechanics to improve ability to complete lifting activity at work.  Baseline: unable Goal status: INITIAL  4.  Patient will ambulate community distances without AD with </=2/10 pain Baseline: SPC, increased pain with prolonged walking.  Goal status: INITIAL  5.  Patient will be able to ascend/descend stairs with reciprocal pattern without UE support Baseline: step to pattern, utilizes handrail and SPC Goal status: INITIAL  6.  Patient will score at least 61% on FOTO to signify clinically meaningful improvement in functional abilities.   Baseline: 30 Goal status: INITIAL   PLAN:  PT FREQUENCY: 2x/week  PT DURATION: 12 weeks  PLANNED INTERVENTIONS: Therapeutic exercises, Therapeutic activity, Neuromuscular re-education, Balance training, Gait training, Patient/Family education, Self Care, Joint mobilization, Dry Needling, Electrical stimulation, Cryotherapy, Moist heat, Vasopneumatic device, Manual therapy, and Re-evaluation  PLAN FOR NEXT SESSION: review and progress HEP; quad strengthening, gait training. Knee ROM  Jannette Spanner, PTA 11/10/22 10:19 AM Phone: [phone] Fax: [phone]

## 2022-11-12 NOTE — Therapy (Signed)
OUTPATIENT PHYSICAL THERAPY LOWER EXTREMITY TREATMENT   Patient Name: Kathleen Duffy MRN: [id number] DOB:06/25/1990, 32 y.o., female Today's Date: 11/13/2022  END OF SESSION:  PT End of Session - 11/13/22 1159     Visit Number 4    Number of Visits 25    Date for PT Re-Evaluation 01/31/23    Authorization Type [employer]/healthy blue    Authorization Time Period 8/20-11/17/2024    Authorization - Visit Number 3    Authorization - Number of Visits 12    PT Start Time 1157               Past Medical History:  Diagnosis Date   Migraines    Past Surgical History:  Procedure Laterality Date   ANTERIOR CRUCIATE LIGAMENT REPAIR Right    There are no problems to display for this patient.   PCP: none   REFERRING PROVIDER: Armida Sans, PA-C  REFERRING DIAG: s/p ACL reconstruction 10/16/2022  THERAPY DIAG:  No diagnosis found.  Rationale for Evaluation and Treatment: Rehabilitation  ONSET DATE: 10/16/22  SUBJECTIVE:   SUBJECTIVE STATEMENT: " Doing good no pain today."  PERTINENT HISTORY: Lt ACLR 10/16/22 PAIN:  Are you having pain? Yes: NPRS scale: 0/10 currently  Pain location: Lt anterior knee  Pain description: ache  Aggravating factors: sleep positioning,prolonged walking Relieving factors: elevation  PRECAUTIONS: Knee and Fall   WEIGHT BEARING RESTRICTIONS: Yes WBAT  FALLS:  Has patient fallen in last 6 months? Yes. 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, push/pull cart)   PLOF: Independent  PATIENT GOALS: "go back to work."  NEXT MD VISIT: September 2024   OBJECTIVE:   DIAGNOSTIC FINDINGS: none on file   PATIENT SURVEYS:  FOTO 30% function to  61% predicted   COGNITION: Overall cognitive status: Within functional limits for tasks assessed     SENSATION: Not tested  EDEMA:  Mild swelling about Lt knee   MUSCLE LENGTH: Not assessed   POSTURE: maintains Lt knee in flexion in standing   PALPATION: Diffuse tenderness about Lt anterior knee   LOWER EXTREMITY ROM:  Active ROM Right eval Left eval Left 11/06/2022 Left  11/10/22 Left 11/13/2022  Hip flexion       Hip extension       Hip abduction       Hip adduction       Hip internal rotation       Hip external rotation       Knee flexion  65 87 102 110  Knee extension  Lacking 6 16    Ankle dorsiflexion       Ankle plantarflexion       Ankle inversion       Ankle eversion        (Blank rows = not tested)  LOWER EXTREMITY MMT:  MMT Right eval Left eval  Hip flexion  4  Hip extension    Hip abduction    Hip adduction    Hip internal rotation    Hip external rotation    Knee flexion    Knee extension    Ankle dorsiflexion    Ankle plantarflexion    Ankle inversion    Ankle eversion     (  Blank rows = not tested); Lt knee MMT deferred due to post-op acuity   LOWER EXTREMITY SPECIAL TESTS:  N/A  FUNCTIONAL TESTS:  TUG 17.3 seconds with SPC  GAIT: Distance walked: 10 ft  Assistive device utilized: Single point cane Level of assistance: Modified independence Comments: limited knee flexion/extension during stance and swing on LLE, limited push-off LLE, slow gait speed.   [facility] Adult PT Treatment:                                                DATE: 11/12/2022 Therapeutic Exercise: Recumbent bike full revolutions forward x 6 min lowering seat every 2 min to promote flexion Prone quad stretch 2 x 60  sec with strap Prone hamstring curl with hip extension 2 x 10  Bridged 2 x 12 with LLE flexed to max available end range  Sidelying hip abduction 2 x 12 Sit to stand 2 x 10 from table Standing TKE with ball behind knee with weight shift to the L 2 x 10  holding 3-5 sec  Updated HEP today    [facility] Adult PT Treatment:                                                DATE: 11/10/22 Therapeutic Exercise: Rec bike full backward, then forward ROM.  Seated calf stretch with strap  Supine h/s stretch with strap  QS 5 sec x 10  SLR with initial QS to fatigue- quad lag Heel slides AROM, AAROM to 102 deg Side hip abduction 10 x 2   Modalities: Vasopnuematic low compression x 15 min , coldest    [facility] Adult PT Treatment:                                                DATE: 11/06/2022 Therapeutic Exercise: Supine hamstring stretch 2 x 30 sec Quad set 1 x 10 holding 10 sec  SLR 1 x 5 quad lag noted going 1/2 range Recumbent bike L1 x 5 min backward full revolutions  Reviewed HEP Manual Therapy: Tibiofemoral PA/ AP mobs grade III Neuromuscular re-ed: Gait training with SPC focusing on heel strike/ toe off Modalities: Vasopnuematic compression x 15 min    PATIENT EDUCATION:  Education details: see treatment  Person educated: Patient Education method: Explanation, Demonstration, Tactile cues, Verbal cues, and Handouts Education comprehension: verbalized understanding, returned demonstration, verbal cues required, tactile cues required, and needs further education  HOME EXERCISE PROGRAM: Access Code: [email or web address] URL: [email or web address] Date: 11/13/2022 Prepared by: Lulu Riding  Exercises - Supine Heel Slide  - 2 x daily - 7 x weekly - 1 sets - 10 reps - 5 sec  hold - Supine Quad Set  - 2 x daily - 7 x weekly - 2 sets - 10 reps - 5 sec  hold - Long Sitting Calf Stretch with Strap  - 2 x daily - 7 x weekly - 3 sets - 30 sec  hold - Seated Hamstring Stretch  - 2 x daily - 7 x weekly - 3 sets - 30 sec  hold - Seated Knee Flexion AAROM  -  2 x daily - 7 x weekly - 1 sets - 10 reps - 5 sec  hold - Sidelying Hip Abduction (Mirrored)  - 1 x daily - 7 x weekly - 3 sets - 15 reps - Prone Knee Flexion  - 1 x daily - 7 x weekly  - 2 sets - 15 reps - Standing Terminal Knee Extension at Wall with Ball  - 1 x daily - 7 x weekly - 2 sets - 10 reps - 5 seconds hold  ASSESSMENT:  CLINICAL IMPRESSION: 11/13/2022 Kathleen Duffy arrives to session noting no pain or issues today. Limited session due to pt arriving late today. She is making progress with flexion increasing ROM to 110 degrees. Focused session primarily on strengthening to emphasize both strength and ROM which she did well with. Updated HEP today and she declined modalities at end of session.    EVALUATION: Patient is a 32 y.o. female who was seen today for physical therapy evaluation and treatment for s/p Lt ACL reconstruction with FGL graft-link on 10/16/22. She demonstrates ROM, strength, gait and balance deficits that are consistent with her recent post-operative status. She was encouraged to continue to wear her brace due to poor quad activation with patient verbalizing understanding. She will benefit from skilled PT to address the above stated deficits in order to return to optimal function.   OBJECTIVE IMPAIRMENTS: Abnormal gait, decreased activity tolerance, decreased balance, decreased endurance, decreased knowledge of condition, difficulty walking, decreased ROM, decreased strength, improper body mechanics, postural dysfunction, and pain.   ACTIVITY LIMITATIONS: carrying, lifting, bending, standing, squatting, stairs, transfers, and locomotion level  PARTICIPATION LIMITATIONS: meal prep, cleaning, laundry, driving, shopping, community activity, and occupation  PERSONAL FACTORS: Age, Fitness, Profession, and Time since onset of injury/illness/exacerbation are also affecting patient's functional outcome.   REHAB POTENTIAL: Good  CLINICAL DECISION MAKING: Stable/uncomplicated  EVALUATION COMPLEXITY: Low   GOALS: Goals reviewed with patient? Yes  SHORT TERM GOALS: Target date: 12/15/2022   Patient will be independent and compliant with initial HEP.    Baseline: issued at eval.  Goal status: INITIAL  2.  Patient will perform SLR without quad lag to improve gait stability.  Baseline: see above Goal status: INITIAL  3.  Patient will demonstrate at least 90 degrees of Lt knee flexion AROM to improve ability to complete sit to stand.   Baseline: see above Goal status: INITIAL  4.  Patient will demonstrate full Lt knee extension AROM to improve gait mechanics.  Baseline: see above Goal status: INITIAL  5. Patient will report pain at worst rated as </=5/10 to reduce current functional limitations.   Baseline: see above  Goal Status: INITIAL    LONG TERM GOALS: Target date: 01/31/2023    Patient will demonstrate 5/5 Lt knee strength to improve stability with stair and curb negotiation.  Baseline: deferred due to post-op acuity Goal status: INITIAL  2.  Patient will demonstrate at least 120 degrees of Lt knee flexion AROM to improve ability to complete bending activity.  Baseline: see above Goal status: INITIAL  3.  Patient will demonstrate normalized and pain free squat mechanics to improve ability to complete lifting activity at work.  Baseline: unable Goal status: INITIAL  4.  Patient will ambulate community distances without AD with </=2/10 pain Baseline: SPC, increased pain with prolonged walking.  Goal status: INITIAL  5.  Patient will be able to ascend/descend stairs with reciprocal pattern without UE support Baseline: step to pattern, utilizes handrail and SPC Goal status: INITIAL  6.  Patient will score at least 61% on FOTO to signify clinically meaningful improvement in functional abilities.   Baseline: 30 Goal status: INITIAL   PLAN:  PT FREQUENCY: 2x/week  PT DURATION: 12 weeks  PLANNED INTERVENTIONS: Therapeutic exercises, Therapeutic activity, Neuromuscular re-education, Balance training, Gait training, Patient/Family education, Self Care, Joint mobilization, Dry Needling, Electrical stimulation,  Cryotherapy, Moist heat, Vasopneumatic device, Manual therapy, and Re-evaluation  PLAN FOR NEXT SESSION: review and progress HEP; quad strengthening, gait training. Knee ROM  Erlinda Solinger PT, DPT, LAT, ATC  11/13/22  12:00 PM

## 2022-11-13 ENCOUNTER — Ambulatory Visit: Payer: Commercial Managed Care - HMO | Admitting: Physical Therapy

## 2022-11-13 ENCOUNTER — Encounter: Payer: Self-pay | Admitting: Physical Therapy

## 2022-11-13 DIAGNOSIS — M25562 Pain in left knee: Secondary | ICD-10-CM

## 2022-11-13 DIAGNOSIS — R2689 Other abnormalities of gait and mobility: Secondary | ICD-10-CM

## 2022-11-13 DIAGNOSIS — R600 Localized edema: Secondary | ICD-10-CM

## 2022-11-13 DIAGNOSIS — M6281 Muscle weakness (generalized): Secondary | ICD-10-CM

## 2022-11-17 NOTE — Therapy (Signed)
OUTPATIENT PHYSICAL THERAPY LOWER EXTREMITY TREATMENT   Patient Name: Kathleen Duffy MRN: [id number] DOB:January 13, 1991, 32 y.o., female Today's Date: 11/18/2022  END OF SESSION:  PT End of Session - 11/18/22 1421     Visit Number 5    Number of Visits 25    Date for PT Re-Evaluation 01/31/23    Authorization Type [employer]/healthy blue    Authorization Time Period 8/20-11/17/2024    Authorization - Visit Number 4    Authorization - Number of Visits 12    PT Start Time 1421    PT Stop Time 1501    PT Time Calculation (min) 40 min    Activity Tolerance Patient tolerated treatment well    Behavior During Therapy WFL for tasks assessed/performed                Past Medical History:  Diagnosis Date   Migraines    Past Surgical History:  Procedure Laterality Date   ANTERIOR CRUCIATE LIGAMENT REPAIR Right    There are no problems to display for this patient.   PCP: none   REFERRING PROVIDER: Armida Sans, PA-C  REFERRING DIAG: s/p ACL reconstruction 10/16/2022  THERAPY DIAG:  Acute pain of left knee  Muscle weakness (generalized)  Other abnormalities of gait and mobility  Localized edema  Rationale for Evaluation and Treatment: Rehabilitation  ONSET DATE: 10/16/22  SUBJECTIVE:   SUBJECTIVE STATEMENT: "I am a little sore from doing more over the weekending."  PERTINENT HISTORY: Lt ACLR 10/16/22 PAIN:  Are you having pain? Yes: NPRS scale: 5/10 currently  Pain location: Lt anterior knee  Pain description: ache  Aggravating factors: sleep positioning,prolonged walking Relieving factors: elevation  PRECAUTIONS: Knee and Fall   WEIGHT BEARING RESTRICTIONS: Yes WBAT  FALLS:  Has patient fallen in last 6 months? Yes. 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, push/pull cart)   PLOF: Independent  PATIENT GOALS: "go back to work."  NEXT MD VISIT: September 2024   OBJECTIVE:   DIAGNOSTIC FINDINGS: none on file   PATIENT SURVEYS:  FOTO 30% function to 61% predicted   COGNITION: Overall cognitive status: Within functional limits for tasks assessed     SENSATION: Not tested  EDEMA:  Mild swelling about Lt knee   MUSCLE LENGTH: Not assessed   POSTURE: maintains Lt knee in flexion in standing   PALPATION: Diffuse tenderness about Lt anterior knee   LOWER EXTREMITY ROM:  Active ROM Right eval Left eval Left 11/06/2022 Left  11/10/22 Left 11/13/2022 Left 11/18/2022  Hip flexion        Hip extension        Hip abduction        Hip adduction        Hip internal rotation        Hip external rotation        Knee flexion  65 87 102 110 110  Knee extension  Lacking 6 16   12   Ankle dorsiflexion        Ankle plantarflexion        Ankle inversion        Ankle eversion         (Blank rows = not tested)  LOWER EXTREMITY MMT:  MMT Right eval Left eval  Hip flexion  4  Hip extension    Hip abduction    Hip adduction    Hip internal rotation    Hip external rotation    Knee flexion    Knee extension    Ankle dorsiflexion    Ankle plantarflexion    Ankle inversion    Ankle eversion     (Blank rows = not tested); Lt knee MMT deferred due to post-op acuity   LOWER EXTREMITY SPECIAL TESTS:  N/A  FUNCTIONAL TESTS:  TUG 17.3 seconds with SPC  GAIT: Distance walked: 10 ft  Assistive device utilized: Single point cane Level of assistance: Modified independence Comments: limited knee flexion/extension during stance and swing on LLE, limited push-off LLE, slow gait speed.   [facility] Adult PT Treatment:                                                DATE: 11/18/2022 Therapeutic Exercise: Recumbent bike full  revolutions forward x 6 min lowering seat every 2 min to promote flexion Prone quad stretch with strap 2 x 30 sec Blood flow restriction 60% occlusion = 156 mmHg  Quad set 30 reps, 15 reps, 15 reps, 15 reps with 30 sec break between ea. Manual Therapy: Tibiofemoral mobs grade IV PA/AP   [facility] Adult PT Treatment:                                                DATE: 11/12/2022 Therapeutic Exercise: Recumbent bike full revolutions forward x 6 min lowering seat every 2 min to promote flexion Prone quad stretch 2 x 60  sec with strap Prone hamstring curl with hip extension 2 x 10  Bridged 2 x 12 with LLE flexed to max available end range  Sidelying hip abduction 2 x 12 Sit to stand 2 x 10 from table Standing TKE with ball behind knee with weight shift to the L 2 x 10 holding 3-5 sec  Updated HEP today    [facility] Adult PT Treatment:                                                DATE: 11/10/22 Therapeutic Exercise: Rec bike full backward, then forward ROM.  Seated calf stretch with strap  Supine h/s stretch with strap  QS 5 sec x 10  SLR with initial QS to fatigue- quad lag Heel slides AROM, AAROM to 102 deg Side hip abduction 10 x 2   Modalities: Vasopnuematic low compression x 15 min , coldest   PATIENT EDUCATION:  Education details: see treatment  Person educated: Patient Education method: Explanation, Demonstration, Tactile cues, Verbal cues, and Handouts Education comprehension: verbalized understanding, returned demonstration, verbal cues required, tactile cues required, and needs further education  HOME EXERCISE PROGRAM: Access Code: [email or web address] URL: [email or web address] Date: 11/13/2022 Prepared by: Lulu Riding  Exercises - Supine Heel Slide  - 2 x daily - 7 x weekly - 1 sets - 10 reps - 5 sec  hold - Supine Quad Set  - 2 x daily -  7 x weekly - 2 sets - 10 reps - 5 sec  hold - Long Sitting Calf Stretch with Strap  - 2 x daily - 7 x weekly - 3 sets - 30  sec  hold - Seated Hamstring Stretch  - 2 x daily - 7 x weekly - 3 sets - 30 sec  hold - Seated Knee Flexion AAROM  - 2 x daily - 7 x weekly - 1 sets - 10 reps - 5 sec  hold - Sidelying Hip Abduction (Mirrored)  - 1 x daily - 7 x weekly - 3 sets - 15 reps - Prone Knee Flexion  - 1 x daily - 7 x weekly - 2 sets - 15 reps - Standing Terminal Knee Extension at Wall with Ball  - 1 x daily - 7 x weekly - 2 sets - 10 reps - 5 seconds hold  ASSESSMENT:  CLINICAL IMPRESSION: 11/18/2022 Pt is 4 weeks and 2 days post op today. She is making progress with knee ROM today's total arc ROM was 8 - 110 degrees which visually improved follow manual techniques and exercise today. Utilized BFR today to promote quad activation and strengthening which she did well with. End of session she reported feeling like she got a work out in and declined modalities.    EVALUATION: Patient is a 32 y.o. female who was seen today for physical therapy evaluation and treatment for s/p Lt ACL reconstruction with FGL graft-link on 10/16/22. She demonstrates ROM, strength, gait and balance deficits that are consistent with her recent post-operative status. She was encouraged to continue to wear her brace due to poor quad activation with patient verbalizing understanding. She will benefit from skilled PT to address the above stated deficits in order to return to optimal function.   OBJECTIVE IMPAIRMENTS: Abnormal gait, decreased activity tolerance, decreased balance, decreased endurance, decreased knowledge of condition, difficulty walking, decreased ROM, decreased strength, improper body mechanics, postural dysfunction, and pain.   ACTIVITY LIMITATIONS: carrying, lifting, bending, standing, squatting, stairs, transfers, and locomotion level  PARTICIPATION LIMITATIONS: meal prep, cleaning, laundry, driving, shopping, community activity, and occupation  PERSONAL FACTORS: Age, Fitness, Profession, and Time since onset of  injury/illness/exacerbation are also affecting patient's functional outcome.   REHAB POTENTIAL: Good  CLINICAL DECISION MAKING: Stable/uncomplicated  EVALUATION COMPLEXITY: Low   GOALS: Goals reviewed with patient? Yes  SHORT TERM GOALS: Target date: 12/15/2022   Patient will be independent and compliant with initial HEP.   Baseline: issued at eval.  Goal status: INITIAL  2.  Patient will perform SLR without quad lag to improve gait stability.  Baseline: see above Goal status: INITIAL  3.  Patient will demonstrate at least 90 degrees of Lt knee flexion AROM to improve ability to complete sit to stand.   Baseline: see above Goal status: INITIAL  4.  Patient will demonstrate full Lt knee extension AROM to improve gait mechanics.  Baseline: see above Goal status: INITIAL  5. Patient will report pain at worst rated as </=5/10 to reduce current functional limitations.   Baseline: see above  Goal Status: INITIAL    LONG TERM GOALS: Target date: 01/31/2023    Patient will demonstrate 5/5 Lt knee strength to improve stability with stair and curb negotiation.  Baseline: deferred due to post-op acuity Goal status: INITIAL  2.  Patient will demonstrate at least 120 degrees of Lt knee flexion AROM to improve ability to complete bending activity.  Baseline: see above Goal status: INITIAL  3.  Patient will demonstrate normalized and pain free squat mechanics to improve ability to complete lifting activity at work.  Baseline: unable Goal status: INITIAL  4.  Patient will ambulate community distances without AD with </=2/10 pain Baseline: SPC, increased pain with prolonged walking.  Goal status: INITIAL  5.  Patient will be able to ascend/descend stairs with reciprocal pattern without UE support Baseline: step to pattern, utilizes handrail and SPC Goal status: INITIAL  6.  Patient will score at least 61% on FOTO to signify clinically meaningful improvement in functional  abilities.   Baseline: 30 Goal status: INITIAL   PLAN:  PT FREQUENCY: 2x/week  PT DURATION: 12 weeks  PLANNED INTERVENTIONS: Therapeutic exercises, Therapeutic activity, Neuromuscular re-education, Balance training, Gait training, Patient/Family education, Self Care, Joint mobilization, Dry Needling, Electrical stimulation, Cryotherapy, Moist heat, Vasopneumatic device, Manual therapy, and Re-evaluation  PLAN FOR NEXT SESSION: review and progress HEP; quad strengthening, gait training. Knee ROM  Cadi Rhinehart PT, DPT, LAT, ATC  11/18/22  3:10 PM

## 2022-11-18 ENCOUNTER — Ambulatory Visit: Payer: Commercial Managed Care - HMO | Attending: Surgical | Admitting: Physical Therapy

## 2022-11-18 ENCOUNTER — Encounter: Payer: Self-pay | Admitting: Physical Therapy

## 2022-11-18 DIAGNOSIS — M25562 Pain in left knee: Secondary | ICD-10-CM | POA: Diagnosis present

## 2022-11-18 DIAGNOSIS — R600 Localized edema: Secondary | ICD-10-CM | POA: Diagnosis present

## 2022-11-18 DIAGNOSIS — R2689 Other abnormalities of gait and mobility: Secondary | ICD-10-CM | POA: Insufficient documentation

## 2022-11-18 DIAGNOSIS — M6281 Muscle weakness (generalized): Secondary | ICD-10-CM | POA: Diagnosis present

## 2022-11-18 NOTE — Therapy (Signed)
OUTPATIENT PHYSICAL THERAPY LOWER EXTREMITY TREATMENT   Patient Name: Kathleen Duffy MRN: [id number] DOB:[birth year], 32 y.o., female Today's Date: 11/21/2022  END OF SESSION:  PT End of Session - 11/21/22 1331     Visit Number 6    Number of Visits 25    Date for PT Re-Evaluation 01/31/23    Authorization Type [employer]/healthy blue    Authorization Time Period 8/20-11/17/2024    Authorization - Visit Number 5    Authorization - Number of Visits 12    PT Start Time 1322    PT Stop Time 1415    PT Time Calculation (min) 53 min    Activity Tolerance Patient tolerated treatment well    Behavior During Therapy WFL for tasks assessed/performed                 Past Medical History:  Diagnosis Date   Migraines    Past Surgical History:  Procedure Laterality Date   ANTERIOR CRUCIATE LIGAMENT REPAIR Right    There are no problems to display for this patient.   PCP: none   REFERRING PROVIDER: Armida Sans, PA-C  REFERRING DIAG: s/p ACL reconstruction 10/16/2022  THERAPY DIAG:  Acute pain of left knee  Muscle weakness (generalized)  Other abnormalities of gait and mobility  Localized edema  Rationale for Evaluation and Treatment: Rehabilitation  ONSET DATE: 10/16/22  SUBJECTIVE:   SUBJECTIVE STATEMENT: " My L knee is feeling good, no pain"  PERTINENT HISTORY: Lt ACLR 10/16/22 PAIN:  Are you having pain? Yes: NPRS scale: 0/10 currently  Pain location: Lt anterior knee  Pain description: ache  Aggravating factors: sleep positioning,prolonged walking Relieving factors: elevation  PRECAUTIONS: Knee and Fall   WEIGHT BEARING RESTRICTIONS: Yes WBAT  FALLS:  Has patient fallen in last 6 months? Yes. 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, push/pull cart)   PLOF: Independent  PATIENT GOALS: "go back to work."  NEXT MD VISIT: September 2024   OBJECTIVE:   DIAGNOSTIC FINDINGS: none on file   PATIENT SURVEYS:  FOTO 30% function to 61% predicted   COGNITION: Overall cognitive status: Within functional limits for tasks assessed     SENSATION: Not tested  EDEMA:  Mild swelling about Lt knee   MUSCLE LENGTH: Not assessed   POSTURE: maintains Lt knee in flexion in standing   PALPATION: Diffuse tenderness about Lt anterior knee   LOWER EXTREMITY ROM:  Active ROM Right eval Left eval Left 11/06/2022 Left  11/10/22 Left 11/13/2022 Left 11/18/2022 LT 11/21/22  Hip flexion         Hip extension         Hip abduction         Hip adduction         Hip internal rotation         Hip external rotation         Knee flexion  65 87 102 110 110 117  Knee extension  Lacking 6 16   12 6  lacking  Ankle dorsiflexion         Ankle plantarflexion         Ankle inversion         Ankle eversion          (  Blank rows = not tested)  LOWER EXTREMITY MMT:  MMT Right eval Left eval  Hip flexion  4  Hip extension    Hip abduction    Hip adduction    Hip internal rotation    Hip external rotation    Knee flexion    Knee extension    Ankle dorsiflexion    Ankle plantarflexion    Ankle inversion    Ankle eversion     (Blank rows = not tested); Lt knee MMT deferred due to post-op acuity   LOWER EXTREMITY SPECIAL TESTS:  N/A  FUNCTIONAL TESTS:  TUG 17.3 seconds with SPC  GAIT: Distance walked: 10 ft  Assistive device utilized: Single point cane Level of assistance: Modified independence Comments: limited knee flexion/extension during stance and swing on LLE, limited push-off LLE, slow gait speed.   [facility] Adult PT Treatment:                                                DATE: 11/21/22 Therapeutic Exercise: Recumbent bike full  revolutions forward x 6 min lowering seat every 2 min to promote flexion QS x10 5" SLR c QS x10 Standing TKE with ball behind knee with weight shift to the L 2 x 10 holding 5 sec Prone hamstring curl with hip extension 2 x 10  Bridged 2 x 12 with LLE flexed to max available end range  Sidelying hip abduction 2 x 12 Sit to stand 2 x 10 from mat table. Min decreased wt bearing through L LE vs R LE Manual Therapy: Tibiofemoral mobs grade IV PA/AP  [facility] Adult PT Treatment:                                                DATE: 11/18/2022 Therapeutic Exercise: Recumbent bike full revolutions forward x 6 min lowering seat every 2 min to promote flexion Prone quad stretch with strap 2 x 30 sec Blood flow restriction 60% occlusion = 156 mmHg  Quad set 30 reps, 15 reps, 15 reps, 15 reps with 30 sec break between ea. Manual Therapy: Tibiofemoral mobs grade IV PA/AP   [facility] Adult PT Treatment:                                                DATE: 11/12/2022 Therapeutic Exercise: Recumbent bike full revolutions forward x 6 min lowering seat every 2 min to promote flexion Prone quad stretch 2 x 60  sec with strap Prone hamstring curl with hip extension 2 x 10  Bridged 2 x 12 with LLE flexed to max available end range  Sidelying hip abduction 2 x 12 Sit to stand 2 x 10 from table Standing TKE with ball behind knee with weight shift to the L 2 x 10 holding 3-5 sec  Updated HEP today    [facility] Adult PT Treatment:  DATE: 11/10/22 Therapeutic Exercise: Rec bike full backward, then forward ROM.  Seated calf stretch with strap  Supine h/s stretch with strap  QS 5 sec x 10  SLR with initial QS to fatigue- quad lag Heel slides AROM, AAROM to 102 deg Side hip abduction 10 x 2   Modalities: Vasopnuematic low compression x 15 min , coldest   PATIENT EDUCATION:  Education details: see treatment  Person educated: Patient Education method: Explanation,  Demonstration, Tactile cues, Verbal cues, and Handouts Education comprehension: verbalized understanding, returned demonstration, verbal cues required, tactile cues required, and needs further education  HOME EXERCISE PROGRAM: Access Code: [email or web address] URL: [email or web address] Date: 11/13/2022 Prepared by: Lulu Riding  Exercises - Supine Heel Slide  - 2 x daily - 7 x weekly - 1 sets - 10 reps - 5 sec  hold - Supine Quad Set  - 2 x daily - 7 x weekly - 2 sets - 10 reps - 5 sec  hold - Long Sitting Calf Stretch with Strap  - 2 x daily - 7 x weekly - 3 sets - 30 sec  hold - Seated Hamstring Stretch  - 2 x daily - 7 x weekly - 3 sets - 30 sec  hold - Seated Knee Flexion AAROM  - 2 x daily - 7 x weekly - 1 sets - 10 reps - 5 sec  hold - Sidelying Hip Abduction (Mirrored)  - 1 x daily - 7 x weekly - 3 sets - 15 reps - Prone Knee Flexion  - 1 x daily - 7 x weekly - 2 sets - 15 reps - Standing Terminal Knee Extension at Wall with Ball  - 1 x daily - 7 x weekly - 2 sets - 10 reps - 5 seconds hold  ASSESSMENT:  CLINICAL IMPRESSION: 11/20/22: PT ws completed for L knee ROM and strengthening per manual techniques and therex. AROM for the L knee continues to make good progress at 7-117d. Quad lag is still present c SLR. Pt reports improved quad use c  wt shifting for TKE c ball on wall. Pt's L knee AROM continues to increase, and pt notices improved functional strength with wt shifting in standing, sit to stand and walking. A cold pack was applied at the EOS for symptom management. Pt tolerated PT today without adverse effects.   Pt is 4 weeks and 2 days post op today. She is making progress with knee ROM today's total arc ROM was 8 - 110 degrees which visually improved follow manual techniques and exercise today. Utilized BFR today to promote quad activation and strengthening which she did well with. End of session she reported feeling like she got a work out in and declined modalities.     EVALUATION: Patient is a 32 y.o. female who was seen today for physical therapy evaluation and treatment for s/p Lt ACL reconstruction with FGL graft-link on 10/16/22. She demonstrates ROM, strength, gait and balance deficits that are consistent with her recent post-operative status. She was encouraged to continue to wear her brace due to poor quad activation with patient verbalizing understanding. She will benefit from skilled PT to address the above stated deficits in order to return to optimal function.   OBJECTIVE IMPAIRMENTS: Abnormal gait, decreased activity tolerance, decreased balance, decreased endurance, decreased knowledge of condition, difficulty walking, decreased ROM, decreased strength, improper body mechanics, postural dysfunction, and pain.   ACTIVITY LIMITATIONS: carrying, lifting, bending, standing, squatting, stairs, transfers, and locomotion level  PARTICIPATION LIMITATIONS: meal prep, cleaning,  laundry, driving, shopping, community activity, and occupation  PERSONAL FACTORS: Age, Fitness, Profession, and Time since onset of injury/illness/exacerbation are also affecting patient's functional outcome.   REHAB POTENTIAL: Good  CLINICAL DECISION MAKING: Stable/uncomplicated  EVALUATION COMPLEXITY: Low   GOALS: Goals reviewed with patient? Yes  SHORT TERM GOALS: Target date: 12/15/2022   Patient will be independent and compliant with initial HEP.   Baseline: issued at eval.  Goal status: INITIAL  2.  Patient will perform SLR without quad lag to improve gait stability.  Baseline: see above Goal status: INITIAL  3.  Patient will demonstrate at least 90 degrees of Lt knee flexion AROM to improve ability to complete sit to stand.   Baseline: see above Goal status: MET  4.  Patient will demonstrate full Lt knee extension AROM to improve gait mechanics.  Baseline: see above Goal status: INITIAL  5. Patient will report pain at worst rated as </=5/10 to reduce  current functional limitations.   Baseline: see above  Goal Status: INITIAL    LONG TERM GOALS: Target date: 01/31/2023    Patient will demonstrate 5/5 Lt knee strength to improve stability with stair and curb negotiation.  Baseline: deferred due to post-op acuity Goal status: INITIAL  2.  Patient will demonstrate at least 120 degrees of Lt knee flexion AROM to improve ability to complete bending activity.  Baseline: see above Goal status: INITIAL  3.  Patient will demonstrate normalized and pain free squat mechanics to improve ability to complete lifting activity at work.  Baseline: unable Goal status: INITIAL  4.  Patient will ambulate community distances without AD with </=2/10 pain Baseline: SPC, increased pain with prolonged walking.  Goal status: INITIAL  5.  Patient will be able to ascend/descend stairs with reciprocal pattern without UE support Baseline: step to pattern, utilizes handrail and SPC Goal status: INITIAL  6.  Patient will score at least 61% on FOTO to signify clinically meaningful improvement in functional abilities.   Baseline: 30 Goal status: INITIAL   PLAN:  PT FREQUENCY: 2x/week  PT DURATION: 12 weeks  PLANNED INTERVENTIONS: Therapeutic exercises, Therapeutic activity, Neuromuscular re-education, Balance training, Gait training, Patient/Family education, Self Care, Joint mobilization, Dry Needling, Electrical stimulation, Cryotherapy, Moist heat, Vasopneumatic device, Manual therapy, and Re-evaluation  PLAN FOR NEXT SESSION: review and progress HEP; quad strengthening, gait training. Knee ROM  Wynema Garoutte MS, PT 11/21/22 2:22 PM

## 2022-11-21 ENCOUNTER — Ambulatory Visit: Payer: Commercial Managed Care - HMO

## 2022-11-21 DIAGNOSIS — R2689 Other abnormalities of gait and mobility: Secondary | ICD-10-CM

## 2022-11-21 DIAGNOSIS — M25562 Pain in left knee: Secondary | ICD-10-CM | POA: Diagnosis not present

## 2022-11-21 DIAGNOSIS — M6281 Muscle weakness (generalized): Secondary | ICD-10-CM

## 2022-11-21 DIAGNOSIS — R600 Localized edema: Secondary | ICD-10-CM

## 2022-11-24 ENCOUNTER — Ambulatory Visit: Payer: Commercial Managed Care - HMO | Admitting: Physical Therapy

## 2022-11-24 ENCOUNTER — Encounter: Payer: Self-pay | Admitting: Physical Therapy

## 2022-11-24 DIAGNOSIS — M25562 Pain in left knee: Secondary | ICD-10-CM

## 2022-11-24 NOTE — Therapy (Signed)
OUTPATIENT PHYSICAL THERAPY LOWER EXTREMITY TREATMENT   Patient Name: Kathleen Duffy MRN: [id number] DOB:[birth year], 32 y.o., female Today's Date: 11/24/2022  END OF SESSION:  PT End of Session - 11/24/22 1415     Visit Number 7    Number of Visits 25    Date for PT Re-Evaluation 01/31/23    Authorization Type [employer]/healthy blue    Authorization Time Period 8/20-11/17/2024    Authorization - Visit Number 6    Authorization - Number of Visits 12    PT Start Time 0210    PT Stop Time 0250    PT Time Calculation (min) 40 min                  Past Medical History:  Diagnosis Date   Migraines    Past Surgical History:  Procedure Laterality Date   ANTERIOR CRUCIATE LIGAMENT REPAIR Right    There are no problems to display for this patient.   PCP: none   REFERRING PROVIDER: Armida Sans, PA-C  REFERRING DIAG: s/p ACL reconstruction 10/16/2022  THERAPY DIAG:  Acute pain of left knee  Rationale for Evaluation and Treatment: Rehabilitation  ONSET DATE: 10/16/22  SUBJECTIVE:   SUBJECTIVE STATEMENT: " My L knee is feeling good, no pain"  PERTINENT HISTORY: Lt ACLR 10/16/22 PAIN:  Are you having pain? Yes: NPRS scale: 0/10 currently  Pain location: Lt anterior knee  Pain description: ache  Aggravating factors: sleep positioning,prolonged walking Relieving factors: elevation  PRECAUTIONS: Knee and Fall   WEIGHT BEARING RESTRICTIONS: Yes WBAT  FALLS:  Has patient fallen in last 6 months? Yes. 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, push/pull cart)   PLOF: Independent  PATIENT GOALS: "go back to work."  NEXT MD VISIT: September 2024   OBJECTIVE:    DIAGNOSTIC FINDINGS: none on file   PATIENT SURVEYS:  FOTO 30% function to 61% predicted   COGNITION: Overall cognitive status: Within functional limits for tasks assessed     SENSATION: Not tested  EDEMA:  Mild swelling about Lt knee   MUSCLE LENGTH: Not assessed   POSTURE: maintains Lt knee in flexion in standing   PALPATION: Diffuse tenderness about Lt anterior knee   LOWER EXTREMITY ROM:  Active ROM Right eval Left eval Left 11/06/2022 Left  11/10/22 Left 11/13/2022 Left 11/18/2022 LT 11/21/22 11/24/22 LT  Hip flexion          Hip extension          Hip abduction          Hip adduction          Hip internal rotation          Hip external rotation          Knee flexion  65 87 102 110 110 117 115  Knee extension  Lacking 6 16   12 6  lacking   Ankle dorsiflexion          Ankle plantarflexion          Ankle inversion          Ankle eversion           (Blank rows = not tested)  LOWER EXTREMITY MMT:  MMT Right eval Left eval  Hip flexion  4  Hip extension    Hip abduction    Hip adduction    Hip internal rotation    Hip external rotation    Knee flexion    Knee extension    Ankle dorsiflexion    Ankle plantarflexion    Ankle inversion    Ankle eversion     (Blank rows = not tested); Lt knee MMT deferred due to post-op acuity   LOWER EXTREMITY SPECIAL TESTS:  N/A  FUNCTIONAL TESTS:  TUG 17.3 seconds with SPC  GAIT: Distance walked: 10 ft  Assistive device utilized: Single point cane Level of assistance: Modified independence Comments: limited knee flexion/extension during stance and swing on LLE, limited push-off LLE, slow gait speed.   [facility] Adult PT Treatment:                                                DATE: 11/24/22 Therapeutic Exercise: Prone quad stretch with strap  Prone knee hang QS 5 sec x 10 into towels  LAQ isometric using Exercise ball 5 sec x 10 STS working on equal weight  Seated H/s Stretch EOM Heel raises  Gastroc and  soleus stretches     [facility] Adult PT Treatment:                                                DATE: 11/21/22 Therapeutic Exercise: Recumbent bike full revolutions forward x 6 min lowering seat every 2 min to promote flexion QS x10 5" SLR c QS x10 Standing TKE with ball behind knee with weight shift to the L 2 x 10 holding 5 sec Prone hamstring curl with hip extension 2 x 10  Bridged 2 x 12 with LLE flexed to max available end range  Sidelying hip abduction 2 x 12 Sit to stand 2 x 10 from mat table. Min decreased wt bearing through L LE vs R LE Manual Therapy: Tibiofemoral mobs grade IV PA/AP  [facility] Adult PT Treatment:                                                DATE: 11/18/2022 Therapeutic Exercise: Recumbent bike full revolutions forward x 6 min lowering seat every 2 min to promote flexion Prone quad stretch with strap 2 x 30 sec Blood flow restriction 60% occlusion = 156 mmHg  Quad set 30 reps, 15 reps, 15 reps, 15 reps with 30 sec break between ea. Manual Therapy: Tibiofemoral mobs grade IV PA/AP   [facility] Adult PT Treatment:                                                DATE: 11/12/2022 Therapeutic Exercise: Recumbent bike full revolutions forward x 6 min lowering seat every 2 min to promote flexion Prone quad stretch 2 x 60  sec with strap Prone hamstring curl with hip extension 2 x 10  Bridged 2 x 12 with  LLE flexed to max available end range  Sidelying hip abduction 2 x 12 Sit to stand 2 x 10 from table Standing TKE with ball behind knee with weight shift to the L 2 x 10 holding 3-5 sec  Updated HEP today    [facility] Adult PT Treatment:                                                DATE: 11/10/22 Therapeutic Exercise: Rec bike full backward, then forward ROM.  Seated calf stretch with strap  Supine h/s stretch with strap  QS 5 sec x 10  SLR with initial QS to fatigue- quad lag Heel slides AROM, AAROM to 102 deg Side hip abduction 10 x 2   Modalities: Vasopnuematic low  compression x 15 min , coldest   PATIENT EDUCATION:  Education details: see treatment  Person educated: Patient Education method: Explanation, Demonstration, Tactile cues, Verbal cues, and Handouts Education comprehension: verbalized understanding, returned demonstration, verbal cues required, tactile cues required, and needs further education  HOME EXERCISE PROGRAM: Access Code: [email or web address] URL: [email or web address] Date: 11/13/2022 Prepared by: Lulu Riding  Exercises - Supine Heel Slide  - 2 x daily - 7 x weekly - 1 sets - 10 reps - 5 sec  hold - Supine Quad Set  - 2 x daily - 7 x weekly - 2 sets - 10 reps - 5 sec  hold - Long Sitting Calf Stretch with Strap  - 2 x daily - 7 x weekly - 3 sets - 30 sec  hold - Seated Hamstring Stretch  - 2 x daily - 7 x weekly - 3 sets - 30 sec  hold - Seated Knee Flexion AAROM  - 2 x daily - 7 x weekly - 1 sets - 10 reps - 5 sec  hold - Sidelying Hip Abduction (Mirrored)  - 1 x daily - 7 x weekly - 3 sets - 15 reps - Prone Knee Flexion  - 1 x daily - 7 x weekly - 2 sets - 15 reps - Standing Terminal Knee Extension at Wall with Ball  - 1 x daily - 7 x weekly - 2 sets - 10 reps - 5 seconds hold  ASSESSMENT:  CLINICAL IMPRESSION: Pt reports she continues to work on quad activation. She has superior patella glide and fair quad contraction with QS. Passively she lacks a few degrees of extension and demonstrates min quad lag. Continued with ROM and quad activation. She did well with prone knee hang and closed chain calf stretches Continues to ambulate with SPC and knee brace.    EVALUATION: Patient is a 32 y.o. female who was seen today for physical therapy evaluation and treatment for s/p Lt ACL reconstruction with FGL graft-link on 10/16/22. She demonstrates ROM, strength, gait and balance deficits that are consistent with her recent post-operative status. She was encouraged to continue to wear her brace due to poor quad activation with  patient verbalizing understanding. She will benefit from skilled PT to address the above stated deficits in order to return to optimal function.   OBJECTIVE IMPAIRMENTS: Abnormal gait, decreased activity tolerance, decreased balance, decreased endurance, decreased knowledge of condition, difficulty walking, decreased ROM, decreased strength, improper body mechanics, postural dysfunction, and pain.   ACTIVITY LIMITATIONS: carrying, lifting, bending, standing, squatting, stairs, transfers, and locomotion level  PARTICIPATION LIMITATIONS: meal prep, cleaning,  laundry, driving, shopping, community activity, and occupation  PERSONAL FACTORS: Age, Fitness, Profession, and Time since onset of injury/illness/exacerbation are also affecting patient's functional outcome.   REHAB POTENTIAL: Good  CLINICAL DECISION MAKING: Stable/uncomplicated  EVALUATION COMPLEXITY: Low   GOALS: Goals reviewed with patient? Yes  SHORT TERM GOALS: Target date: 12/15/2022   Patient will be independent and compliant with initial HEP.   Baseline: issued at eval.  Goal status: MET  2.  Patient will perform SLR without quad lag to improve gait stability.  Baseline: see above 11/24/22: min lag present  Goal status: ONGOING  3.  Patient will demonstrate at least 90 degrees of Lt knee flexion AROM to improve ability to complete sit to stand.   Baseline: see above Goal status: MET  4.  Patient will demonstrate full Lt knee extension AROM to improve gait mechanics.  Baseline: see above 11/24/22: lacks 3-5 degrees passively  Goal status: ONGOING  5. Patient will report pain at worst rated as </=5/10 to reduce current functional limitations.   Baseline: see above  Goal Status: ONGOING   LONG TERM GOALS: Target date: 01/31/2023    Patient will demonstrate 5/5 Lt knee strength to improve stability with stair and curb negotiation.  Baseline: deferred due to post-op acuity Goal status: INITIAL  2.  Patient will  demonstrate at least 120 degrees of Lt knee flexion AROM to improve ability to complete bending activity.  Baseline: see above Goal status: INITIAL  3.  Patient will demonstrate normalized and pain free squat mechanics to improve ability to complete lifting activity at work.  Baseline: unable Goal status: INITIAL  4.  Patient will ambulate community distances without AD with </=2/10 pain Baseline: SPC, increased pain with prolonged walking.  Goal status: INITIAL  5.  Patient will be able to ascend/descend stairs with reciprocal pattern without UE support Baseline: step to pattern, utilizes handrail and SPC Goal status: INITIAL  6.  Patient will score at least 61% on FOTO to signify clinically meaningful improvement in functional abilities.   Baseline: 30 Goal status: INITIAL   PLAN:  PT FREQUENCY: 2x/week  PT DURATION: 12 weeks  PLANNED INTERVENTIONS: Therapeutic exercises, Therapeutic activity, Neuromuscular re-education, Balance training, Gait training, Patient/Family education, Self Care, Joint mobilization, Dry Needling, Electrical stimulation, Cryotherapy, Moist heat, Vasopneumatic device, Manual therapy, and Re-evaluation  PLAN FOR NEXT SESSION: review and progress HEP; quad strengthening, gait training. Knee ROM  Jannette Spanner, PTA 11/24/22 2:55 PM Phone: [phone] Fax: [phone]

## 2022-11-26 ENCOUNTER — Ambulatory Visit: Payer: Commercial Managed Care - HMO | Admitting: Physical Therapy

## 2022-11-26 ENCOUNTER — Encounter: Payer: Self-pay | Admitting: Physical Therapy

## 2022-11-26 DIAGNOSIS — M6281 Muscle weakness (generalized): Secondary | ICD-10-CM

## 2022-11-26 DIAGNOSIS — M25562 Pain in left knee: Secondary | ICD-10-CM

## 2022-11-26 NOTE — Therapy (Signed)
OUTPATIENT PHYSICAL THERAPY LOWER EXTREMITY TREATMENT   Patient Name: Kathleen Duffy MRN: [id number] DOB:07-28-1990, 32 y.o., female Today's Date: 11/26/2022  END OF SESSION:  PT End of Session - 11/26/22 1425     Visit Number 8    Number of Visits 25    Date for PT Re-Evaluation 01/31/23    Authorization Type [employer]/healthy blue    Authorization Time Period 8/20-11/17/2024    Authorization - Visit Number 7    Authorization - Number of Visits 12    PT Start Time 0222    PT Stop Time 0305    PT Time Calculation (min) 43 min                  Past Medical History:  Diagnosis Date   Migraines    Past Surgical History:  Procedure Laterality Date   ANTERIOR CRUCIATE LIGAMENT REPAIR Right    There are no problems to display for this patient.   PCP: none   REFERRING PROVIDER: Armida Sans, PA-C  REFERRING DIAG: s/p ACL reconstruction 10/16/2022  THERAPY DIAG:  Acute pain of left knee  Muscle weakness (generalized)  Rationale for Evaluation and Treatment: Rehabilitation  ONSET DATE: 10/16/22  SUBJECTIVE:   SUBJECTIVE STATEMENT: " My L knee is feeling good, no pain. MD said keep doing PT, f/u in 4 more weeks."  PERTINENT HISTORY: Lt ACLR 10/16/22 PAIN:  Are you having pain? Yes: NPRS scale: 0/10 currently  Pain location: Lt anterior knee  Pain description: ache  Aggravating factors: sleep positioning,prolonged walking Relieving factors: elevation  PRECAUTIONS: Knee and Fall   WEIGHT BEARING RESTRICTIONS: Yes WBAT  FALLS:  Has patient fallen in last 6 months? Yes. 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, push/pull cart)   PLOF: Independent  PATIENT  GOALS: "go back to work."  NEXT MD VISIT: September 2024 , OCT 9   OBJECTIVE:   DIAGNOSTIC FINDINGS: none on file   PATIENT SURVEYS:  FOTO 30% function to 61% predicted  11/26/22: 57%   COGNITION: Overall cognitive status: Within functional limits for tasks assessed     SENSATION: Not tested  EDEMA:  Mild swelling about Lt knee   MUSCLE LENGTH: Not assessed   POSTURE: maintains Lt knee in flexion in standing   PALPATION: Diffuse tenderness about Lt anterior knee   LOWER EXTREMITY ROM:  Active ROM Right eval Left eval Left 11/06/2022 Left  11/10/22 Left 11/13/2022 Left 11/18/2022 LT 11/21/22 11/24/22 LT  Hip flexion          Hip extension          Hip abduction          Hip adduction          Hip internal rotation          Hip external rotation          Knee flexion  65 87 102 110 110 117 115  Knee extension  Lacking 6 16   12 6  lacking   Ankle dorsiflexion          Ankle plantarflexion          Ankle inversion          Ankle  eversion           (Blank rows = not tested)  LOWER EXTREMITY MMT:  MMT Right eval Left eval  Hip flexion  4  Hip extension    Hip abduction    Hip adduction    Hip internal rotation    Hip external rotation    Knee flexion    Knee extension    Ankle dorsiflexion    Ankle plantarflexion    Ankle inversion    Ankle eversion     (Blank rows = not tested); Lt knee MMT deferred due to post-op acuity   LOWER EXTREMITY SPECIAL TESTS:  N/A  FUNCTIONAL TESTS:  TUG 17.3 seconds with SPC  GAIT: Distance walked: 10 ft  Assistive device utilized: Single point cane Level of assistance: Modified independence Comments: limited knee flexion/extension during stance and swing on LLE, limited push-off LLE, slow gait speed.   [facility] Adult PT Treatment:                                                DATE: 11/26/22 Therapeutic Exercise: Rec Bike L1 x 6 minutes  QS 5 sec x 10 LAQ 90 deg isometric using Exercise ball 5 sec x 10, 60 degree  isometric using exercise ball  Prone knee hang 2 minutes Prone quad stretch 30 sec x 3  Prone QS 5 sec x 10  Supine hamstring stretch with strap  Supine heel propped 5# 2 min STS x 10 Manual Therapy:  Therapeutic Activity: FOTO   [facility] Adult PT Treatment:                                                DATE: 11/24/22 Therapeutic Exercise: Prone quad stretch with strap  Prone knee hang QS 5 sec x 10 into towel LAQ isometric using Exercise ball 5 sec x 10 STS working on equal weight  Seated H/s Stretch EOM Heel raises  Gastroc and soleus stretches     [facility] Adult PT Treatment:                                                DATE: 11/21/22 Therapeutic Exercise: Recumbent bike full revolutions forward x 6 min lowering seat every 2 min to promote flexion QS x10 5" SLR c QS x10 Standing TKE with ball behind knee with weight shift to the L 2 x 10 holding 5 sec Prone hamstring curl with hip extension 2 x 10  Bridged 2 x 12 with LLE flexed to max available end range  Sidelying hip abduction 2 x 12 Sit to stand 2 x 10 from mat table. Min decreased wt bearing through L LE vs R LE Manual Therapy: Tibiofemoral mobs grade IV PA/AP  [facility] Adult PT Treatment:                                                DATE: 11/18/2022 Therapeutic Exercise: Recumbent bike full revolutions forward x 6  min lowering seat every 2 min to promote flexion Prone quad stretch with strap 2 x 30 sec Blood flow restriction 60% occlusion = 156 mmHg  Quad set 30 reps, 15 reps, 15 reps, 15 reps with 30 sec break between ea. Manual Therapy: Tibiofemoral mobs grade IV PA/AP   [facility] Adult PT Treatment:                                                DATE: 11/12/2022 Therapeutic Exercise: Recumbent bike full revolutions forward x 6 min lowering seat every 2 min to promote flexion Prone quad stretch 2 x 60  sec with strap Prone hamstring curl with hip extension 2 x 10  Bridged 2 x 12 with LLE flexed to max available end  range  Sidelying hip abduction 2 x 12 Sit to stand 2 x 10 from table Standing TKE with ball behind knee with weight shift to the L 2 x 10 holding 3-5 sec  Updated HEP today     PATIENT EDUCATION:  Education details: see treatment  Person educated: Patient Education method: Explanation, Demonstration, Tactile cues, Verbal cues, and Handouts Education comprehension: verbalized understanding, returned demonstration, verbal cues required, tactile cues required, and needs further education  HOME EXERCISE PROGRAM: Access Code: [email or web address] URL: [email or web address] Date: 11/13/2022 Prepared by: Lulu Riding  Exercises - Supine Heel Slide  - 2 x daily - 7 x weekly - 1 sets - 10 reps - 5 sec  hold - Supine Quad Set  - 2 x daily - 7 x weekly - 2 sets - 10 reps - 5 sec  hold - Long Sitting Calf Stretch with Strap  - 2 x daily - 7 x weekly - 3 sets - 30 sec  hold - Seated Hamstring Stretch  - 2 x daily - 7 x weekly - 3 sets - 30 sec  hold - Seated Knee Flexion AAROM  - 2 x daily - 7 x weekly - 1 sets - 10 reps - 5 sec  hold - Sidelying Hip Abduction (Mirrored)  - 1 x daily - 7 x weekly - 3 sets - 15 reps - Prone Knee Flexion  - 1 x daily - 7 x weekly - 2 sets - 15 reps - Standing Terminal Knee Extension at Wall with Ball  - 1 x daily - 7 x weekly - 2 sets - 10 reps - 5 seconds hold  ASSESSMENT:  CLINICAL IMPRESSION: Pt reports she continues to work on quad activation, felt a little quad soreness after last session.  FOTO score improved. MD pleased with progress, will return in 4 more weeks. AROM 117 degrees with min quad lag.     EVALUATION: Patient is a 32 y.o. female who was seen today for physical therapy evaluation and treatment for s/p Lt ACL reconstruction with FGL graft-link on 10/16/22. She demonstrates ROM, strength, gait and balance deficits that are consistent with her recent post-operative status. She was encouraged to continue to wear her brace due to poor quad  activation with patient verbalizing understanding. She will benefit from skilled PT to address the above stated deficits in order to return to optimal function.   OBJECTIVE IMPAIRMENTS: Abnormal gait, decreased activity tolerance, decreased balance, decreased endurance, decreased knowledge of condition, difficulty walking, decreased ROM, decreased strength, improper body mechanics, postural dysfunction, and pain.   ACTIVITY LIMITATIONS: carrying, lifting,  bending, standing, squatting, stairs, transfers, and locomotion level  PARTICIPATION LIMITATIONS: meal prep, cleaning, laundry, driving, shopping, community activity, and occupation  PERSONAL FACTORS: Age, Fitness, Profession, and Time since onset of injury/illness/exacerbation are also affecting patient's functional outcome.   REHAB POTENTIAL: Good  CLINICAL DECISION MAKING: Stable/uncomplicated  EVALUATION COMPLEXITY: Low   GOALS: Goals reviewed with patient? Yes  SHORT TERM GOALS: Target date: 12/15/2022   Patient will be independent and compliant with initial HEP.   Baseline: issued at eval.  Goal status: MET  2.  Patient will perform SLR without quad lag to improve gait stability.  Baseline: see above 11/24/22: min lag present  Goal status: ONGOING  3.  Patient will demonstrate at least 90 degrees of Lt knee flexion AROM to improve ability to complete sit to stand.   Baseline: see above Goal status: MET  4.  Patient will demonstrate full Lt knee extension AROM to improve gait mechanics.  Baseline: see above 11/24/22: lacks 3-5 degrees passively  Goal status: ONGOING  5. Patient will report pain at worst rated as </=5/10 to reduce current functional limitations.   Baseline: see above  Goal Status: ONGOING   LONG TERM GOALS: Target date: 01/31/2023    Patient will demonstrate 5/5 Lt knee strength to improve stability with stair and curb negotiation.  Baseline: deferred due to post-op acuity Goal status:  INITIAL  2.  Patient will demonstrate at least 120 degrees of Lt knee flexion AROM to improve ability to complete bending activity.  Baseline: see above Goal status: INITIAL  3.  Patient will demonstrate normalized and pain free squat mechanics to improve ability to complete lifting activity at work.  Baseline: unable Goal status: INITIAL  4.  Patient will ambulate community distances without AD with </=2/10 pain Baseline: SPC, increased pain with prolonged walking.  Goal status: INITIAL  5.  Patient will be able to ascend/descend stairs with reciprocal pattern without UE support Baseline: step to pattern, utilizes handrail and SPC Goal status: INITIAL  6.  Patient will score at least 61% on FOTO to signify clinically meaningful improvement in functional abilities.  Baseline: 30 11/26/22: 57 Goal status: INITIAL   PLAN:  PT FREQUENCY: 2x/week  PT DURATION: 12 weeks  PLANNED INTERVENTIONS: Therapeutic exercises, Therapeutic activity, Neuromuscular re-education, Balance training, Gait training, Patient/Family education, Self Care, Joint mobilization, Dry Needling, Electrical stimulation, Cryotherapy, Moist heat, Vasopneumatic device, Manual therapy, and Re-evaluation  PLAN FOR NEXT SESSION: review and progress HEP; quad strengthening, gait training. Knee ROM  Jannette Spanner, PTA 11/26/22 3:02 PM Phone: [phone] Fax: [phone]

## 2022-12-03 NOTE — Therapy (Signed)
OUTPATIENT PHYSICAL THERAPY LOWER EXTREMITY TREATMENT   Patient Name: Kathleen Duffy MRN: [id number] DOB:1990-07-19, 32 y.o., female Today's Date: 12/04/2022  END OF SESSION:  PT End of Session - 12/04/22 1147     Visit Number 9    Number of Visits 25    Date for PT Re-Evaluation 01/31/23    Authorization Type [employer]/healthy blue    Authorization Time Period 8/20-11/17/2024    Authorization - Visit Number 8    Authorization - Number of Visits 12    PT Start Time 1110    PT Stop Time 1150    PT Time Calculation (min) 40 min    Activity Tolerance Patient tolerated treatment well    Behavior During Therapy WFL for tasks assessed/performed                   Past Medical History:  Diagnosis Date   Migraines    Past Surgical History:  Procedure Laterality Date   ANTERIOR CRUCIATE LIGAMENT REPAIR Right    There are no problems to display for this patient.   PCP: none   REFERRING PROVIDER: Armida Sans, PA-C  REFERRING DIAG: s/p ACL reconstruction 10/16/2022  THERAPY DIAG:  Acute pain of left knee  Muscle weakness (generalized)  Other abnormalities of gait and mobility  Localized edema  Rationale for Evaluation and Treatment: Rehabilitation  ONSET DATE: 10/16/22  SUBJECTIVE:   SUBJECTIVE STATEMENT:  Pt reports her L knee is doing good. Pt reports she uses the [facility] for ambulation in the community, but only as needed in her home  PERTINENT HISTORY: Lt ACLR 10/16/22 PAIN:  Are you having pain? Yes: NPRS scale: 0/10 currently  Pain location: Lt anterior knee  Pain description: ache  Aggravating factors: sleep positioning,prolonged walking Relieving factors: elevation  PRECAUTIONS: Knee and Fall   WEIGHT BEARING RESTRICTIONS: Yes WBAT  FALLS:  Has patient fallen in last 6 months? Yes. 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, push/pull cart)   PLOF: Independent  PATIENT GOALS: "go back to work."  NEXT MD VISIT: September 2024 , OCT 9   OBJECTIVE:   DIAGNOSTIC FINDINGS: none on file   PATIENT SURVEYS:  FOTO 30% function to 61% predicted  11/26/22: 57%   COGNITION: Overall cognitive status: Within functional limits for tasks assessed     SENSATION: Not tested  EDEMA:  Mild swelling about Lt knee   MUSCLE LENGTH: Not assessed   POSTURE: maintains Lt knee in flexion in standing   PALPATION: Diffuse tenderness about Lt anterior knee   LOWER EXTREMITY ROM:  Active ROM Right eval Left eval Left 11/06/2022 Left  11/10/22 Left 11/13/2022 Left 11/18/2022 LT 11/21/22 11/24/22 LT  Hip flexion          Hip extension          Hip abduction          Hip adduction          Hip internal rotation          Hip external rotation          Knee flexion  65 87 102 110 110 117 115  Knee extension  Lacking 6 16   12  6 lacking   Ankle dorsiflexion          Ankle plantarflexion          Ankle inversion          Ankle eversion          12/04/22: 4 d lacking for knee ext  (Blank rows = not tested)  LOWER EXTREMITY MMT:  MMT Right eval Left eval  Hip flexion  4  Hip extension    Hip abduction    Hip adduction    Hip internal rotation    Hip external rotation    Knee flexion    Knee extension    Ankle dorsiflexion    Ankle plantarflexion    Ankle inversion    Ankle eversion     (Blank rows = not tested); Lt knee MMT deferred due to post-op acuity   LOWER EXTREMITY SPECIAL TESTS:  N/A  FUNCTIONAL TESTS:  TUG 17.3 seconds with SPC  GAIT: Distance walked: 10 ft  Assistive device utilized: Single point cane Level of assistance: Modified independence Comments: limited knee flexion/extension during stance and swing  on LLE, limited push-off LLE, slow gait speed.   [facility] Adult PT Treatment:                                                DATE: 12/04/22 Therapeutic Exercise: Rec Bike L1 x 6 minutes  Supine quad set x10 5" Seated quad set c foot on floor c hastring stretch SLR c quad set x10 45d squats 2x10 L SLS L SLS with R hip abd 2x10 Manual Therapy: Tibiofemoral mobs grade IV PA/AP   [facility] Adult PT Treatment:                                                DATE: 11/26/22 Therapeutic Exercise: Rec Bike L1 x 6 minutes  QS 5 sec x 10 LAQ 90 deg isometric using Exercise ball 5 sec x 10, 60 degree isometric using exercise ball  Prone knee hang 2 minutes Prone quad stretch 30 sec x 3  Prone QS 5 sec x 10  Supine hamstring stretch with strap  Supine heel propped 5# 2 min STS x 10 Manual Therapy:  Therapeutic Activity: FOTO   [facility] Adult PT Treatment:                                                DATE: 11/24/22 Therapeutic Exercise: Prone quad stretch with strap  Prone knee hang QS 5 sec x 10 into towel LAQ isometric using Exercise ball 5 sec x 10 STS working on equal weight  Seated H/s Stretch EOM Heel raises  Gastroc and soleus stretches     [facility] Adult PT Treatment:                                                DATE: 11/21/22 Therapeutic Exercise: Recumbent bike full revolutions forward x 6 min lowering seat every  2 min to promote flexion QS x10 5" SLR c QS x10 Standing TKE with ball behind knee with weight shift to the L 2 x 10 holding 5 sec Prone hamstring curl with hip extension 2 x 10  Bridged 2 x 12 with LLE flexed to max available end range  Sidelying hip abduction 2 x 12 Sit to stand 2 x 10 from mat table. Min decreased wt bearing through L LE vs R LE Manual Therapy: Tibiofemoral mobs grade IV PA/AP  [facility] Adult PT Treatment:                                                DATE: 11/18/2022 Therapeutic Exercise: Recumbent bike full revolutions forward x 6 min lowering seat every  2 min to promote flexion Prone quad stretch with strap 2 x 30 sec Blood flow restriction 60% occlusion = 156 mmHg  Quad set 30 reps, 15 reps, 15 reps, 15 reps with 30 sec break between ea. Manual Therapy: Tibiofemoral mobs grade IV PA/AP   PATIENT EDUCATION:  Education details: see treatment  Person educated: Patient Education method: Explanation, Demonstration, Tactile cues, Verbal cues, and Handouts Education comprehension: verbalized understanding, returned demonstration, verbal cues required, tactile cues required, and needs further education  HOME EXERCISE PROGRAM: Access Code: [email or web address] URL: [email or web address] Date: 11/13/2022 Prepared by: Lulu Riding  Exercises - Supine Heel Slide  - 2 x daily - 7 x weekly - 1 sets - 10 reps - 5 sec  hold - Supine Quad Set  - 2 x daily - 7 x weekly - 2 sets - 10 reps - 5 sec  hold - Long Sitting Calf Stretch with Strap  - 2 x daily - 7 x weekly - 3 sets - 30 sec  hold - Seated Hamstring Stretch  - 2 x daily - 7 x weekly - 3 sets - 30 sec  hold - Seated Knee Flexion AAROM  - 2 x daily - 7 x weekly - 1 sets - 10 reps - 5 sec  hold - Sidelying Hip Abduction (Mirrored)  - 1 x daily - 7 x weekly - 3 sets - 15 reps - Prone Knee Flexion  - 1 x daily - 7 x weekly - 2 sets - 15 reps - Standing Terminal Knee Extension at Wall with Ball  - 1 x daily - 7 x weekly - 2 sets - 10 reps - 5 seconds hold  ASSESSMENT:  CLINICAL IMPRESSION: Pt is 7 weeks s/p ACL surgery. PT was completed for quad activation and for knee ext ROM. Therex were completed with more emphasis on CKC exs. L knee ext made a minimal gain in ext AROM. Min quad lag is still present. Pt tolerated the prescribed therex without adverse effects. Pt will continue to benefit from skilled PT to address impairments for improved L knee/LE function.   EVALUATION: Patient is a 32 y.o. female who was seen today for physical therapy evaluation and treatment for s/p Lt ACL  reconstruction with FGL graft-link on 10/16/22. She demonstrates ROM, strength, gait and balance deficits that are consistent with her recent post-operative status. She was encouraged to continue to wear her brace due to poor quad activation with patient verbalizing understanding. She will benefit from skilled PT to address the above stated deficits in order to return to optimal function.   OBJECTIVE IMPAIRMENTS: Abnormal  gait, decreased activity tolerance, decreased balance, decreased endurance, decreased knowledge of condition, difficulty walking, decreased ROM, decreased strength, improper body mechanics, postural dysfunction, and pain.   ACTIVITY LIMITATIONS: carrying, lifting, bending, standing, squatting, stairs, transfers, and locomotion level  PARTICIPATION LIMITATIONS: meal prep, cleaning, laundry, driving, shopping, community activity, and occupation  PERSONAL FACTORS: Age, Fitness, Profession, and Time since onset of injury/illness/exacerbation are also affecting patient's functional outcome.   REHAB POTENTIAL: Good  CLINICAL DECISION MAKING: Stable/uncomplicated  EVALUATION COMPLEXITY: Low   GOALS: Goals reviewed with patient? Yes  SHORT TERM GOALS: Target date: 12/15/2022   Patient will be independent and compliant with initial HEP.   Baseline: issued at eval.  Goal status: MET  2.  Patient will perform SLR without quad lag to improve gait stability.  Baseline: see above 11/24/22: min lag present  Goal status: ONGOING  3.  Patient will demonstrate at least 90 degrees of Lt knee flexion AROM to improve ability to complete sit to stand.   Baseline: see above Goal status: MET  4.  Patient will demonstrate full Lt knee extension AROM to improve gait mechanics.  Baseline: see above 11/24/22: lacks 3-5 degrees passively  Goal status: ONGOING  5. Patient will report pain at worst rated as </=5/10 to reduce current functional limitations.   Baseline: see above  Goal Status:  ONGOING   LONG TERM GOALS: Target date: 01/31/2023    Patient will demonstrate 5/5 Lt knee strength to improve stability with stair and curb negotiation.  Baseline: deferred due to post-op acuity Goal status: INITIAL  2.  Patient will demonstrate at least 120 degrees of Lt knee flexion AROM to improve ability to complete bending activity.  Baseline: see above Goal status: INITIAL  3.  Patient will demonstrate normalized and pain free squat mechanics to improve ability to complete lifting activity at work.  Baseline: unable Goal status: INITIAL  4.  Patient will ambulate community distances without AD with </=2/10 pain Baseline: SPC, increased pain with prolonged walking.  Goal status: INITIAL  5.  Patient will be able to ascend/descend stairs with reciprocal pattern without UE support Baseline: step to pattern, utilizes handrail and SPC Goal status: INITIAL  6.  Patient will score at least 61% on FOTO to signify clinically meaningful improvement in functional abilities.  Baseline: 30 11/26/22: 57 Goal status: INITIAL   PLAN:  PT FREQUENCY: 2x/week  PT DURATION: 12 weeks  PLANNED INTERVENTIONS: Therapeutic exercises, Therapeutic activity, Neuromuscular re-education, Balance training, Gait training, Patient/Family education, Self Care, Joint mobilization, Dry Needling, Electrical stimulation, Cryotherapy, Moist heat, Vasopneumatic device, Manual therapy, and Re-evaluation  PLAN FOR NEXT SESSION: review and progress HEP; quad strengthening, gait training. Knee ROM   Myran Arcia MS, PT 12/04/22 5:56 PM

## 2022-12-04 ENCOUNTER — Ambulatory Visit: Payer: Commercial Managed Care - HMO

## 2022-12-04 DIAGNOSIS — R2689 Other abnormalities of gait and mobility: Secondary | ICD-10-CM

## 2022-12-04 DIAGNOSIS — M25562 Pain in left knee: Secondary | ICD-10-CM

## 2022-12-04 DIAGNOSIS — R600 Localized edema: Secondary | ICD-10-CM

## 2022-12-04 DIAGNOSIS — M6281 Muscle weakness (generalized): Secondary | ICD-10-CM

## 2022-12-08 ENCOUNTER — Encounter: Payer: Self-pay | Admitting: Physical Therapy

## 2022-12-08 ENCOUNTER — Ambulatory Visit: Payer: Commercial Managed Care - HMO | Admitting: Physical Therapy

## 2022-12-08 DIAGNOSIS — M25562 Pain in left knee: Secondary | ICD-10-CM | POA: Diagnosis not present

## 2022-12-08 DIAGNOSIS — M6281 Muscle weakness (generalized): Secondary | ICD-10-CM

## 2022-12-08 DIAGNOSIS — R2689 Other abnormalities of gait and mobility: Secondary | ICD-10-CM

## 2022-12-08 DIAGNOSIS — R600 Localized edema: Secondary | ICD-10-CM

## 2022-12-08 NOTE — Therapy (Signed)
OUTPATIENT PHYSICAL THERAPY LOWER EXTREMITY TREATMENT   Patient Name: Kathleen Duffy MRN: [id number] DOB:1990-12-17, 32 y.o., female Today's Date: 12/08/2022  END OF SESSION:  PT End of Session - 12/08/22 1337     Visit Number 10    Number of Visits 25    Date for PT Re-Evaluation 01/31/23    Authorization Type [employer]/healthy blue    Authorization Time Period 8/20-11/17/2024    Authorization - Visit Number 9    Authorization - Number of Visits 12    PT Start Time 1333    PT Stop Time 1415    PT Time Calculation (min) 42 min                    Past Medical History:  Diagnosis Date   Migraines    Past Surgical History:  Procedure Laterality Date   ANTERIOR CRUCIATE LIGAMENT REPAIR Right    There are no problems to display for this patient.   PCP: none   REFERRING PROVIDER: Armida Sans, PA-C  REFERRING DIAG: s/p ACL reconstruction 10/16/2022  THERAPY DIAG:  Acute pain of left knee  Muscle weakness (generalized)  Other abnormalities of gait and mobility  Localized edema  Rationale for Evaluation and Treatment: Rehabilitation  ONSET DATE: 10/16/22  SUBJECTIVE:   SUBJECTIVE STATEMENT:  "Feeling good no issues or pain   PERTINENT HISTORY: Lt ACLR 10/16/22 PAIN:  Are you having pain? Yes: NPRS scale: 0/10 currently  Pain location: Lt anterior knee  Pain description: ache  Aggravating factors: sleep positioning,prolonged walking Relieving factors: elevation  PRECAUTIONS: Knee and Fall   WEIGHT BEARING RESTRICTIONS: Yes WBAT  FALLS:  Has patient fallen in last 6 months? Yes. 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, push/pull cart)   PLOF:  Independent  PATIENT GOALS: "go back to work."  NEXT MD VISIT: September 2024 , OCT 9   OBJECTIVE:   DIAGNOSTIC FINDINGS: none on file   PATIENT SURVEYS:  FOTO 30% function to 61% predicted  11/26/22: 57%  12/08/22:  50%  COGNITION: Overall cognitive status: Within functional limits for tasks assessed     SENSATION: Not tested  EDEMA:  Mild swelling about Lt knee   MUSCLE LENGTH: Not assessed   POSTURE: maintains Lt knee in flexion in standing   PALPATION: Diffuse tenderness about Lt anterior knee   LOWER EXTREMITY ROM:  Active ROM Right eval Left eval Left 11/06/2022 Left  11/10/22 Left 11/13/2022 Left 11/18/2022 LT 11/21/22 11/24/22 LT 12/08/2022  Hip flexion           Hip extension           Hip abduction           Hip adduction           Hip internal rotation           Hip external rotation           Knee flexion  65 87 102 110 110 117 115 124  Knee extension  Lacking 6 16   12 6  lacking  6  Ankle dorsiflexion           Ankle plantarflexion  Ankle inversion           Ankle eversion           12/04/22: 4 d lacking for knee ext  (Blank rows = not tested)  LOWER EXTREMITY MMT:  MMT Right eval Left eval  Hip flexion  4  Hip extension    Hip abduction    Hip adduction    Hip internal rotation    Hip external rotation    Knee flexion    Knee extension    Ankle dorsiflexion    Ankle plantarflexion    Ankle inversion    Ankle eversion     (Blank rows = not tested); Lt knee MMT deferred due to post-op acuity   LOWER EXTREMITY SPECIAL TESTS:  N/A  FUNCTIONAL TESTS:  TUG 17.3 seconds with SPC  GAIT: Distance walked: 10 ft  Assistive device utilized: Single point cane Level of assistance: Modified independence Comments: limited knee flexion/extension during stance and swing on LLE, limited push-off LLE, slow gait speed.   [facility] Adult PT Treatment:                                                DATE: 12/08/2022 Therapeutic Exercise: Seated  hamstring stretch 2 x 30 sec Step down 3 x 10  LLE with 4 inch step SLR 2 x 10 Standing hip abd/ extension with RTB 2 x 12 with RTB Seated hamstring curl 3 x 10 with RTB   Updated HEP for standing hip strengthening, SLR Manual Therapy: MTPR along the hamstring Tack and stretch of the L hamstring    [facility] Adult PT Treatment:                                                DATE: 12/04/22 Therapeutic Exercise: Rec Bike L1 x 6 minutes  Supine quad set x10 5" Seated quad set c foot on floor c hastring stretch SLR c quad set x10 45d squats 2x10 L SLS L SLS with R hip abd 2x10 Manual Therapy: Tibiofemoral mobs grade IV PA/AP   [facility] Adult PT Treatment:                                                DATE: 11/26/22 Therapeutic Exercise: Rec Bike L1 x 6 minutes  QS 5 sec x 10 LAQ 90 deg isometric using Exercise ball 5 sec x 10, 60 degree isometric using exercise ball  Prone knee hang 2 minutes Prone quad stretch 30 sec x 3  Prone QS 5 sec x 10  Supine hamstring stretch with strap  Supine heel propped 5# 2 min STS x 10 Manual Therapy:  Therapeutic Activity: FOTO    PATIENT EDUCATION:  Education details: see treatment  Person educated: Patient Education method: Explanation, Demonstration, Tactile cues, Verbal cues, and Handouts Education comprehension: verbalized understanding, returned demonstration, verbal cues required, tactile cues required, and needs further education  HOME EXERCISE PROGRAM: Access Code: [email or web address] URL: [email or web address] Date: 12/08/2022 Prepared by: Lulu Riding  Exercises - Supine Heel Slide  - 2 x daily - 7 x weekly - 1  sets - 10 reps - 5 sec  hold - Supine Quad Set  - 2 x daily - 7 x weekly - 2 sets - 10 reps - 5 sec  hold - Long Sitting Calf Stretch with Strap  - 2 x daily - 7 x weekly - 3 sets - 30 sec  hold - Seated Hamstring Stretch  - 2 x daily - 7 x weekly - 3 sets - 30 sec  hold - Seated Knee Flexion AAROM  - 2 x daily -  7 x weekly - 1 sets - 10 reps - 5 sec  hold - Sidelying Hip Abduction (Mirrored)  - 1 x daily - 7 x weekly - 3 sets - 15 reps - Prone Knee Flexion  - 1 x daily - 7 x weekly - 2 sets - 15 reps - Standing Terminal Knee Extension at Wall with Ball  - 1 x daily - 7 x weekly - 2 sets - 10 reps - 5 seconds hold - SLR  - 1 x daily - 7 x weekly - 3 sets - 15 reps - 1 hold - Standing Hip Extension Kicks  - 1 x daily - 7 x weekly - 2 sets - 10 reps - Standing Hip Abduction with Resistance at Ankles and Counter Support  - 1 x daily - 7 x weekly - 2 sets - 10 reps  ASSESSMENT:  CLINICAL IMPRESSION: Sangeeta continues to make good progress with physical therapy. Today she improved her knee ROM increasing total arc to 6 - 124 degrees. Continued STW to promote hamstring flexibility to maximize knee extension. Continued strengthening which she did demonstrate difficult with step downs using 4 inch step require cues to avoid compensation but overall did very well.   EVALUATION: Patient is a 32 y.o. female who was seen today for physical therapy evaluation and treatment for s/p Lt ACL reconstruction with FGL graft-link on 10/16/22. She demonstrates ROM, strength, gait and balance deficits that are consistent with her recent post-operative status. She was encouraged to continue to wear her brace due to poor quad activation with patient verbalizing understanding. She will benefit from skilled PT to address the above stated deficits in order to return to optimal function.   OBJECTIVE IMPAIRMENTS: Abnormal gait, decreased activity tolerance, decreased balance, decreased endurance, decreased knowledge of condition, difficulty walking, decreased ROM, decreased strength, improper body mechanics, postural dysfunction, and pain.   ACTIVITY LIMITATIONS: carrying, lifting, bending, standing, squatting, stairs, transfers, and locomotion level  PARTICIPATION LIMITATIONS: meal prep, cleaning, laundry, driving, shopping, community  activity, and occupation  PERSONAL FACTORS: Age, Fitness, Profession, and Time since onset of injury/illness/exacerbation are also affecting patient's functional outcome.   REHAB POTENTIAL: Good  CLINICAL DECISION MAKING: Stable/uncomplicated  EVALUATION COMPLEXITY: Low   GOALS: Goals reviewed with patient? Yes  SHORT TERM GOALS: Target date: 12/15/2022   Patient will be independent and compliant with initial HEP.   Baseline: issued at eval.  Goal status: MET  2.  Patient will perform SLR without quad lag to improve gait stability.  Baseline: see above 11/24/22: min lag present  Goal status: ONGOING  3.  Patient will demonstrate at least 90 degrees of Lt knee flexion AROM to improve ability to complete sit to stand.   Baseline: see above Goal status: MET  4.  Patient will demonstrate full Lt knee extension AROM to improve gait mechanics.  Baseline: see above 11/24/22: lacks 3-5 degrees passively  Goal status: ONGOING  5. Patient will report pain at worst  rated as </=5/10 to reduce current functional limitations.   Baseline: see above  Goal Status: ONGOING   LONG TERM GOALS: Target date: 01/31/2023    Patient will demonstrate 5/5 Lt knee strength to improve stability with stair and curb negotiation.  Baseline: deferred due to post-op acuity Goal status: INITIAL  2.  Patient will demonstrate at least 120 degrees of Lt knee flexion AROM to improve ability to complete bending activity.  Baseline: see above Goal status: INITIAL  3.  Patient will demonstrate normalized and pain free squat mechanics to improve ability to complete lifting activity at work.  Baseline: unable Goal status: INITIAL  4.  Patient will ambulate community distances without AD with </=2/10 pain Baseline: SPC, increased pain with prolonged walking.  Goal status: INITIAL  5.  Patient will be able to ascend/descend stairs with reciprocal pattern without UE support Baseline: step to pattern,  utilizes handrail and SPC Goal status: INITIAL  6.  Patient will score at least 61% on FOTO to signify clinically meaningful improvement in functional abilities.  Baseline: 30 11/26/22: 57 Goal status: INITIAL   PLAN:  PT FREQUENCY: 2x/week  PT DURATION: 12 weeks  PLANNED INTERVENTIONS: Therapeutic exercises, Therapeutic activity, Neuromuscular re-education, Balance training, Gait training, Patient/Family education, Self Care, Joint mobilization, Dry Needling, Electrical stimulation, Cryotherapy, Moist heat, Vasopneumatic device, Manual therapy, and Re-evaluation  PLAN FOR NEXT SESSION: review and progress HEP; quad strengthening, gait training. Knee ROM   Allen Ralls MS, PT 12/08/22 2:16 PM

## 2022-12-10 ENCOUNTER — Encounter: Payer: Self-pay | Admitting: Physical Therapy

## 2022-12-10 ENCOUNTER — Ambulatory Visit: Payer: Commercial Managed Care - HMO | Admitting: Physical Therapy

## 2022-12-10 DIAGNOSIS — M25562 Pain in left knee: Secondary | ICD-10-CM

## 2022-12-10 DIAGNOSIS — R2689 Other abnormalities of gait and mobility: Secondary | ICD-10-CM

## 2022-12-10 DIAGNOSIS — M6281 Muscle weakness (generalized): Secondary | ICD-10-CM

## 2022-12-10 NOTE — Therapy (Signed)
OUTPATIENT PHYSICAL THERAPY LOWER EXTREMITY TREATMENT   Patient Name: Kathleen Duffy MRN: [id number] DOB:02-03-1991, 32 y.o., female Today's Date: 12/10/2022  END OF SESSION:  PT End of Session - 12/10/22 1325     Visit Number 11    Number of Visits 25    Date for PT Re-Evaluation 01/31/23    Authorization Type [employer]/healthy blue    Authorization Time Period 8/20-11/17/2024    Authorization - Visit Number 10    Authorization - Number of Visits 12    PT Start Time 1321    PT Stop Time 1359    PT Time Calculation (min) 38 min                    Past Medical History:  Diagnosis Date   Migraines    Past Surgical History:  Procedure Laterality Date   ANTERIOR CRUCIATE LIGAMENT REPAIR Right    There are no problems to display for this patient.   PCP: none   REFERRING PROVIDER: Armida Sans, PA-C  REFERRING DIAG: s/p ACL reconstruction 10/16/2022  THERAPY DIAG:  Acute pain of left knee  Muscle weakness (generalized)  Other abnormalities of gait and mobility  Rationale for Evaluation and Treatment: Rehabilitation  ONSET DATE: 10/16/22  SUBJECTIVE:   SUBJECTIVE STATEMENT:  "Feeling good no issues or pain after last session. I do still have pain at night trying to get comfortable."   PERTINENT HISTORY: Lt ACLR 10/16/22 PAIN:  Are you having pain? Yes: NPRS scale: 0/10 currently  Pain location: Lt anterior knee  Pain description: ache  Aggravating factors: sleep positioning,prolonged walking Relieving factors: elevation  PRECAUTIONS: Knee and Fall   WEIGHT BEARING RESTRICTIONS: Yes WBAT  FALLS:  Has patient fallen in last 6 months? Yes. 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, push/pull cart)   PLOF: Independent  PATIENT GOALS: "go back to work."  NEXT MD VISIT: September 2024 , OCT 9   OBJECTIVE:   DIAGNOSTIC FINDINGS: none on file   PATIENT SURVEYS:  FOTO 30% function to 61% predicted  11/26/22: 57%  12/08/22:  50%  COGNITION: Overall cognitive status: Within functional limits for tasks assessed     SENSATION: Not tested  EDEMA:  Mild swelling about Lt knee   MUSCLE LENGTH: Not assessed   POSTURE: maintains Lt knee in flexion in standing   PALPATION: Diffuse tenderness about Lt anterior knee   LOWER EXTREMITY ROM:  Active ROM Right eval Left eval Left 11/06/2022 Left  11/10/22 Left 11/13/2022 Left 11/18/2022 LT 11/21/22 11/24/22 LT 12/08/2022  Hip flexion           Hip extension           Hip abduction           Hip adduction           Hip internal rotation           Hip external rotation           Knee flexion  65 87 102 110 110 117 115 124  Knee extension  Lacking 6 16   12 6  lacking  6  Ankle dorsiflexion  Ankle plantarflexion           Ankle inversion           Ankle eversion           12/04/22: 4 d lacking for knee ext  (Blank rows = not tested)  LOWER EXTREMITY MMT:  MMT Right eval Left eval  Hip flexion  4  Hip extension    Hip abduction    Hip adduction    Hip internal rotation    Hip external rotation    Knee flexion    Knee extension    Ankle dorsiflexion    Ankle plantarflexion    Ankle inversion    Ankle eversion     (Blank rows = not tested); Lt knee MMT deferred due to post-op acuity   LOWER EXTREMITY SPECIAL TESTS:  N/A  FUNCTIONAL TESTS:  TUG 17.3 seconds with SPC  GAIT: Distance walked: 10 ft  Assistive device utilized: Single point cane Level of assistance: Modified independence Comments: limited knee flexion/extension during stance and swing on LLE, limited push-off LLE, slow gait speed.   [facility] Adult PT Treatment:                                                 DATE: 12/10/22 Therapeutic Exercise: Rec Bike L2  Prone knee hang 3# x  3 minutes Prone QS 5 sec x 10 with 3# on posterior knee  Supine hamstring stretch passive Supine QS 5 sec x 10 SLR with initial Quad Set 10 x 2  LAQ 90 deg isometric using Exercise ball 5 sec x 10 STS x 10 H/s curls 10 x 2 green band  4 inch step down 3 x 10   [facility] Adult PT Treatment:                                                DATE: 12/08/2022 Therapeutic Exercise: Seated hamstring stretch 2 x 30 sec Step down 3 x 10  LLE with 4 inch step SLR 2 x 10 Standing hip abd/ extension with RTB 2 x 12 with RTB Seated hamstring curl 3 x 10 with RTB   Updated HEP for standing hip strengthening, SLR Manual Therapy: MTPR along the hamstring Tack and stretch of the L hamstring    [facility] Adult PT Treatment:                                                DATE: 12/04/22 Therapeutic Exercise: Rec Bike L1 x 6 minutes  Supine quad set x10 5" Seated quad set c foot on floor c hastring stretch SLR c quad set x10 45d squats 2x10 L SLS L SLS with R hip abd 2x10 Manual Therapy: Tibiofemoral mobs grade IV PA/AP   [facility] Adult PT Treatment:                                                DATE: 11/26/22 Therapeutic Exercise: Rec Bike  L1 x 6 minutes  QS 5 sec x 10 LAQ 90 deg isometric using Exercise ball 5 sec x 10, 60 degree isometric using exercise ball  Prone knee hang 2 minutes Prone quad stretch 30 sec x 3  Prone QS 5 sec x 10  Supine hamstring stretch with strap  Supine heel propped 5# 2 min STS x 10 Manual Therapy:  Therapeutic Activity: FOTO    PATIENT EDUCATION:  Education details: see treatment  Person educated: Patient Education method: Explanation, Demonstration, Tactile cues, Verbal cues, and Handouts Education comprehension: verbalized understanding, returned demonstration, verbal cues required, tactile cues required, and needs further education  HOME EXERCISE PROGRAM: Access Code:  [email or web address] URL: [email or web address] Date: 12/08/2022 Prepared by: Lulu Riding  Exercises - Supine Heel Slide  - 2 x daily - 7 x weekly - 1 sets - 10 reps - 5 sec  hold - Supine Quad Set  - 2 x daily - 7 x weekly - 2 sets - 10 reps - 5 sec  hold - Long Sitting Calf Stretch with Strap  - 2 x daily - 7 x weekly - 3 sets - 30 sec  hold - Seated Hamstring Stretch  - 2 x daily - 7 x weekly - 3 sets - 30 sec  hold - Seated Knee Flexion AAROM  - 2 x daily - 7 x weekly - 1 sets - 10 reps - 5 sec  hold - Sidelying Hip Abduction (Mirrored)  - 1 x daily - 7 x weekly - 3 sets - 15 reps - Prone Knee Flexion  - 1 x daily - 7 x weekly - 2 sets - 15 reps - Standing Terminal Knee Extension at Wall with Ball  - 1 x daily - 7 x weekly - 2 sets - 10 reps - 5 seconds hold - SLR  - 1 x daily - 7 x weekly - 3 sets - 15 reps - 1 hold - Standing Hip Extension Kicks  - 1 x daily - 7 x weekly - 2 sets - 10 reps - Standing Hip Abduction with Resistance at Ankles and Counter Support  - 1 x daily - 7 x weekly - 2 sets - 10 reps  ASSESSMENT:  CLINICAL IMPRESSION: Lujuana continues to make good progress with physical therapy. Continued with quad activation and knee extension ROM. She tolerated session without adverse effects.   EVALUATION: Patient is a 32 y.o. female who was seen today for physical therapy evaluation and treatment for s/p Lt ACL reconstruction with FGL graft-link on 10/16/22. She demonstrates ROM, strength, gait and balance deficits that are consistent with her recent post-operative status. She was encouraged to continue to wear her brace due to poor quad activation with patient verbalizing understanding. She will benefit from skilled PT to address the above stated deficits in order to return to optimal function.   OBJECTIVE IMPAIRMENTS: Abnormal gait, decreased activity tolerance, decreased balance, decreased endurance, decreased knowledge of condition, difficulty walking, decreased ROM,  decreased strength, improper body mechanics, postural dysfunction, and pain.   ACTIVITY LIMITATIONS: carrying, lifting, bending, standing, squatting, stairs, transfers, and locomotion level  PARTICIPATION LIMITATIONS: meal prep, cleaning, laundry, driving, shopping, community activity, and occupation  PERSONAL FACTORS: Age, Fitness, Profession, and Time since onset of injury/illness/exacerbation are also affecting patient's functional outcome.   REHAB POTENTIAL: Good  CLINICAL DECISION MAKING: Stable/uncomplicated  EVALUATION COMPLEXITY: Low   GOALS: Goals reviewed with patient? Yes  SHORT TERM GOALS: Target date: 12/15/2022   Patient will be independent and  compliant with initial HEP.   Baseline: issued at eval.  Goal status: MET  2.  Patient will perform SLR without quad lag to improve gait stability.  Baseline: see above 11/24/22: min lag present  Goal status: ONGOING  3.  Patient will demonstrate at least 90 degrees of Lt knee flexion AROM to improve ability to complete sit to stand.   Baseline: see above Goal status: MET  4.  Patient will demonstrate full Lt knee extension AROM to improve gait mechanics.  Baseline: see above 11/24/22: lacks 3-5 degrees passively  Goal status: ONGOING  5. Patient will report pain at worst rated as </=5/10 to reduce current functional limitations.   Baseline: see above  12/10/22: 7-8/10 at night  Goal Status: ONGOING   LONG TERM GOALS: Target date: 01/31/2023    Patient will demonstrate 5/5 Lt knee strength to improve stability with stair and curb negotiation.  Baseline: deferred due to post-op acuity Goal status: INITIAL  2.  Patient will demonstrate at least 120 degrees of Lt knee flexion AROM to improve ability to complete bending activity.  Baseline: see above Goal status: INITIAL  3.  Patient will demonstrate normalized and pain free squat mechanics to improve ability to complete lifting activity at work.  Baseline:  unable Goal status: INITIAL  4.  Patient will ambulate community distances without AD with </=2/10 pain Baseline: SPC, increased pain with prolonged walking.  Goal status: INITIAL  5.  Patient will be able to ascend/descend stairs with reciprocal pattern without UE support Baseline: step to pattern, utilizes handrail and SPC Goal status: INITIAL  6.  Patient will score at least 61% on FOTO to signify clinically meaningful improvement in functional abilities.  Baseline: 30 11/26/22: 57 Goal status: INITIAL   PLAN:  PT FREQUENCY: 2x/week  PT DURATION: 12 weeks  PLANNED INTERVENTIONS: Therapeutic exercises, Therapeutic activity, Neuromuscular re-education, Balance training, Gait training, Patient/Family education, Self Care, Joint mobilization, Dry Needling, Electrical stimulation, Cryotherapy, Moist heat, Vasopneumatic device, Manual therapy, and Re-evaluation  PLAN FOR NEXT SESSION: review and progress HEP; quad strengthening, gait training. Knee ROM   Jannette Spanner, PTA 12/10/22 2:00 PM Phone: [phone] Fax: [phone]

## 2022-12-15 NOTE — Therapy (Signed)
OUTPATIENT PHYSICAL THERAPY LOWER EXTREMITY TREATMENT   Patient Name: Kathleen Duffy MRN: [id number] DOB:1991/01/19, 32 y.o., female Today's Date: 12/15/2022  END OF SESSION:           Past Medical History:  Diagnosis Date   Migraines    Past Surgical History:  Procedure Laterality Date   ANTERIOR CRUCIATE LIGAMENT REPAIR Right    There are no problems to display for this patient.   PCP: none   REFERRING PROVIDER: Armida Sans, PA-C  REFERRING DIAG: s/p ACL reconstruction 10/16/2022  THERAPY DIAG:  No diagnosis found.  Rationale for Evaluation and Treatment: Rehabilitation  ONSET DATE: 10/16/22  SUBJECTIVE:   SUBJECTIVE STATEMENT:  "Feeling good no issues or pain after last session. I do still have pain at night trying to get comfortable."   PERTINENT HISTORY: Lt ACLR 10/16/22 PAIN:  Are you having pain? Yes: NPRS scale: 0/10 currently  Pain location: Lt anterior knee  Pain description: ache  Aggravating factors: sleep positioning,prolonged walking Relieving factors: elevation  PRECAUTIONS: Knee and Fall   WEIGHT BEARING RESTRICTIONS: Yes WBAT  FALLS:  Has patient fallen in last 6 months? Yes. 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, push/pull cart)   PLOF: Independent  PATIENT GOALS: "go back to work."  NEXT MD VISIT: September 2024 , OCT 9   OBJECTIVE:   DIAGNOSTIC FINDINGS: none on file   PATIENT SURVEYS:  FOTO 30% function to 61% predicted  11/26/22: 57%  12/08/22:  50%  COGNITION: Overall cognitive status: Within functional limits for tasks assessed     SENSATION: Not tested  EDEMA:  Mild swelling about Lt knee   MUSCLE  LENGTH: Not assessed   POSTURE: maintains Lt knee in flexion in standing   PALPATION: Diffuse tenderness about Lt anterior knee   LOWER EXTREMITY ROM:  Active ROM Right eval Left eval Left 11/06/2022 Left  11/10/22 Left 11/13/2022 Left 11/18/2022 LT 11/21/22 11/24/22 LT 12/08/2022  Hip flexion           Hip extension           Hip abduction           Hip adduction           Hip internal rotation           Hip external rotation           Knee flexion  65 87 102 110 110 117 115 124  Knee extension  Lacking 6 16   12 6  lacking  6  Ankle dorsiflexion           Ankle plantarflexion           Ankle inversion           Ankle eversion           12/04/22: 4 d lacking for knee ext  (Blank rows = not tested)  LOWER EXTREMITY MMT:  MMT Right eval Left eval  Hip flexion  4  Hip extension    Hip abduction    Hip adduction    Hip internal rotation    Hip external rotation    Knee flexion    Knee extension    Ankle dorsiflexion  Ankle plantarflexion    Ankle inversion    Ankle eversion     (Blank rows = not tested); Lt knee MMT deferred due to post-op acuity   LOWER EXTREMITY SPECIAL TESTS:  N/A  FUNCTIONAL TESTS:  TUG 17.3 seconds with SPC  GAIT: Distance walked: 10 ft  Assistive device utilized: Single point cane Level of assistance: Modified independence Comments: limited knee flexion/extension during stance and swing on LLE, limited push-off LLE, slow gait speed.  [facility] Adult PT Treatment:                                                DATE: 12/16/22 9 weeks Therapeutic Exercise: *** Manual Therapy: *** Neuromuscular re-ed: *** Therapeutic Activity: *** Modalities: *** Self Care: ***    [facility] Adult PT Treatment:                                                DATE: 12/10/22 Therapeutic Exercise: Rec Bike L2  Prone knee hang 3# x  3 minutes Prone QS 5 sec x 10 with 3# on posterior knee  Supine hamstring stretch passive Supine QS 5 sec x 10 SLR with initial  Quad Set 10 x 2  LAQ 90 deg isometric using Exercise ball 5 sec x 10 STS x 10 H/s curls 10 x 2 green band  4 inch step down 3 x 10   [facility] Adult PT Treatment:                                                DATE: 12/08/2022 Therapeutic Exercise: Seated hamstring stretch 2 x 30 sec Step down 3 x 10  LLE with 4 inch step SLR 2 x 10 Standing hip abd/ extension with RTB 2 x 12 with RTB Seated hamstring curl 3 x 10 with RTB   Updated HEP for standing hip strengthening, SLR Manual Therapy: MTPR along the hamstring Tack and stretch of the L hamstring    [facility] Adult PT Treatment:                                                DATE: 12/04/22 Therapeutic Exercise: Rec Bike L1 x 6 minutes  Supine quad set x10 5" Seated quad set c foot on floor c hastring stretch SLR c quad set x10 45d squats 2x10 L SLS L SLS with R hip abd 2x10 Manual Therapy: Tibiofemoral mobs grade IV PA/AP   [facility] Adult PT Treatment:                                                DATE: 11/26/22 Therapeutic Exercise: Rec Bike L1 x 6 minutes  QS 5 sec x 10 LAQ 90 deg isometric using Exercise ball 5 sec x 10, 60 degree isometric using exercise ball  Prone knee hang 2 minutes  Prone quad stretch 30 sec x 3  Prone QS 5 sec x 10  Supine hamstring stretch with strap  Supine heel propped 5# 2 min STS x 10 Manual Therapy:  Therapeutic Activity: FOTO    PATIENT EDUCATION:  Education details: see treatment  Person educated: Patient Education method: Explanation, Demonstration, Tactile cues, Verbal cues, and Handouts Education comprehension: verbalized understanding, returned demonstration, verbal cues required, tactile cues required, and needs further education  HOME EXERCISE PROGRAM: Access Code: [email or web address] URL: [email or web address] Date: 12/08/2022 Prepared by: Lulu Riding  Exercises - Supine Heel Slide  - 2 x daily - 7 x weekly - 1 sets - 10 reps - 5 sec  hold - Supine Quad Set  - 2 x  daily - 7 x weekly - 2 sets - 10 reps - 5 sec  hold - Long Sitting Calf Stretch with Strap  - 2 x daily - 7 x weekly - 3 sets - 30 sec  hold - Seated Hamstring Stretch  - 2 x daily - 7 x weekly - 3 sets - 30 sec  hold - Seated Knee Flexion AAROM  - 2 x daily - 7 x weekly - 1 sets - 10 reps - 5 sec  hold - Sidelying Hip Abduction (Mirrored)  - 1 x daily - 7 x weekly - 3 sets - 15 reps - Prone Knee Flexion  - 1 x daily - 7 x weekly - 2 sets - 15 reps - Standing Terminal Knee Extension at Wall with Ball  - 1 x daily - 7 x weekly - 2 sets - 10 reps - 5 seconds hold - SLR  - 1 x daily - 7 x weekly - 3 sets - 15 reps - 1 hold - Standing Hip Extension Kicks  - 1 x daily - 7 x weekly - 2 sets - 10 reps - Standing Hip Abduction with Resistance at Ankles and Counter Support  - 1 x daily - 7 x weekly - 2 sets - 10 reps  ASSESSMENT:  CLINICAL IMPRESSION: Rianna continues to make good progress with physical therapy. Continued with quad activation and knee extension ROM. She tolerated session without adverse effects.   EVALUATION: Patient is a 32 y.o. female who was seen today for physical therapy evaluation and treatment for s/p Lt ACL reconstruction with FGL graft-link on 10/16/22. She demonstrates ROM, strength, gait and balance deficits that are consistent with her recent post-operative status. She was encouraged to continue to wear her brace due to poor quad activation with patient verbalizing understanding. She will benefit from skilled PT to address the above stated deficits in order to return to optimal function.   OBJECTIVE IMPAIRMENTS: Abnormal gait, decreased activity tolerance, decreased balance, decreased endurance, decreased knowledge of condition, difficulty walking, decreased ROM, decreased strength, improper body mechanics, postural dysfunction, and pain.   ACTIVITY LIMITATIONS: carrying, lifting, bending, standing, squatting, stairs, transfers, and locomotion level  PARTICIPATION  LIMITATIONS: meal prep, cleaning, laundry, driving, shopping, community activity, and occupation  PERSONAL FACTORS: Age, Fitness, Profession, and Time since onset of injury/illness/exacerbation are also affecting patient's functional outcome.   REHAB POTENTIAL: Good  CLINICAL DECISION MAKING: Stable/uncomplicated  EVALUATION COMPLEXITY: Low   GOALS: Goals reviewed with patient? Yes  SHORT TERM GOALS: Target date: 12/15/2022   Patient will be independent and compliant with initial HEP.   Baseline: issued at eval.  Goal status: MET  2.  Patient will perform SLR without quad lag to improve gait stability.  Baseline: see above 11/24/22:  min lag present  Goal status: ONGOING  3.  Patient will demonstrate at least 90 degrees of Lt knee flexion AROM to improve ability to complete sit to stand.   Baseline: see above Goal status: MET  4.  Patient will demonstrate full Lt knee extension AROM to improve gait mechanics.  Baseline: see above 11/24/22: lacks 3-5 degrees passively  Goal status: ONGOING  5. Patient will report pain at worst rated as </=5/10 to reduce current functional limitations.   Baseline: see above  12/10/22: 7-8/10 at night  Goal Status: ONGOING   LONG TERM GOALS: Target date: 01/31/2023    Patient will demonstrate 5/5 Lt knee strength to improve stability with stair and curb negotiation.  Baseline: deferred due to post-op acuity Goal status: INITIAL  2.  Patient will demonstrate at least 120 degrees of Lt knee flexion AROM to improve ability to complete bending activity.  Baseline: see above Goal status: INITIAL  3.  Patient will demonstrate normalized and pain free squat mechanics to improve ability to complete lifting activity at work.  Baseline: unable Goal status: INITIAL  4.  Patient will ambulate community distances without AD with </=2/10 pain Baseline: SPC, increased pain with prolonged walking.  Goal status: INITIAL  5.  Patient will be able to  ascend/descend stairs with reciprocal pattern without UE support Baseline: step to pattern, utilizes handrail and SPC Goal status: INITIAL  6.  Patient will score at least 61% on FOTO to signify clinically meaningful improvement in functional abilities.  Baseline: 30 11/26/22: 57 Goal status: INITIAL   PLAN:  PT FREQUENCY: 2x/week  PT DURATION: 12 weeks  PLANNED INTERVENTIONS: Therapeutic exercises, Therapeutic activity, Neuromuscular re-education, Balance training, Gait training, Patient/Family education, Self Care, Joint mobilization, Dry Needling, Electrical stimulation, Cryotherapy, Moist heat, Vasopneumatic device, Manual therapy, and Re-evaluation  PLAN FOR NEXT SESSION: review and progress HEP; quad strengthening, gait training. Knee ROM   Etna Forquer MS, PT 12/15/22 11:38 AM

## 2022-12-16 ENCOUNTER — Ambulatory Visit: Payer: Commercial Managed Care - HMO | Attending: Surgical

## 2022-12-16 DIAGNOSIS — M6281 Muscle weakness (generalized): Secondary | ICD-10-CM | POA: Diagnosis present

## 2022-12-16 DIAGNOSIS — R2689 Other abnormalities of gait and mobility: Secondary | ICD-10-CM | POA: Insufficient documentation

## 2022-12-16 DIAGNOSIS — R600 Localized edema: Secondary | ICD-10-CM | POA: Diagnosis present

## 2022-12-16 DIAGNOSIS — M25562 Pain in left knee: Secondary | ICD-10-CM | POA: Diagnosis present

## 2022-12-18 ENCOUNTER — Ambulatory Visit: Payer: Commercial Managed Care - HMO | Admitting: Physical Therapy

## 2022-12-18 ENCOUNTER — Telehealth: Payer: Self-pay | Admitting: Physical Therapy

## 2022-12-18 NOTE — Telephone Encounter (Signed)
Contacted patient regarding no show. Left voicemail for next appointment date and time. Also offered appointment for later today.

## 2022-12-22 ENCOUNTER — Encounter: Payer: Self-pay | Admitting: Physical Therapy

## 2022-12-22 ENCOUNTER — Ambulatory Visit: Payer: Commercial Managed Care - HMO | Admitting: Physical Therapy

## 2022-12-22 DIAGNOSIS — R2689 Other abnormalities of gait and mobility: Secondary | ICD-10-CM

## 2022-12-22 DIAGNOSIS — M25562 Pain in left knee: Secondary | ICD-10-CM

## 2022-12-22 DIAGNOSIS — M6281 Muscle weakness (generalized): Secondary | ICD-10-CM

## 2022-12-22 NOTE — Therapy (Addendum)
OUTPATIENT PHYSICAL THERAPY LOWER EXTREMITY TREATMENT   Patient Name: Kathleen Duffy MRN: [id number] DOB:1990/09/21, 32 y.o., female Today's Date: 12/22/2022  END OF SESSION:  PT End of Session - 12/22/22 1323     Visit Number 13    Number of Visits 25    Date for PT Re-Evaluation 01/31/23    Authorization Type [employer]    Authorization Time Period 8/20-11/17/2024    Authorization - Visit Number 12    Authorization - Number of Visits 12    PT Start Time 1315    PT Stop Time 1400    PT Time Calculation (min) 45 min                     Past Medical History:  Diagnosis Date   Migraines    Past Surgical History:  Procedure Laterality Date   ANTERIOR CRUCIATE LIGAMENT REPAIR Right    There are no problems to display for this patient.   PCP: none   REFERRING PROVIDER: Armida Sans, PA-C  REFERRING DIAG: s/p ACL reconstruction 10/16/2022  THERAPY DIAG:  Acute pain of left knee  Other abnormalities of gait and mobility  Muscle weakness (generalized)  Rationale for Evaluation and Treatment: Rehabilitation  ONSET DATE: 10/16/22  SUBJECTIVE:   SUBJECTIVE STATEMENT:  Pt reports walking full time with SPC with occasional use of SPC for longer distances.  PERTINENT HISTORY: Lt ACLR 10/16/22 PAIN:  Are you having pain? Yes: NPRS scale: 0/10 currently  Pain location: Lt anterior knee  Pain description: ache  Aggravating factors: sleep positioning,prolonged walking Relieving factors: elevation  PRECAUTIONS: Knee and Fall   WEIGHT BEARING RESTRICTIONS: Yes WBAT  FALLS:  Has patient fallen in last 6 months? Yes. 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, push/pull cart)   PLOF: Independent  PATIENT GOALS: "go back to work."  NEXT MD VISIT: September 2024 , OCT 9   OBJECTIVE:   DIAGNOSTIC FINDINGS: none on file   PATIENT SURVEYS:  FOTO 30% function to 61% predicted  11/26/22: 57%  12/08/22:  50%  COGNITION: Overall cognitive status: Within functional limits for tasks assessed     SENSATION: Not tested  EDEMA:  Mild swelling about Lt knee   MUSCLE LENGTH: Not assessed   POSTURE: maintains Lt knee in flexion in standing   PALPATION: Diffuse tenderness about Lt anterior knee   LOWER EXTREMITY ROM:  Active ROM Right eval Left eval Left 11/06/2022 Left  11/10/22 Left 11/13/2022 Left 11/18/2022 LT 11/21/22 11/24/22 LT 12/08/2022 LT 12/22/22 LT  Hip flexion            Hip extension            Hip abduction            Hip adduction            Hip internal rotation            Hip external rotation            Knee flexion  65 87 102 110 110 117 115 124 125  Knee extension  Lacking 6 16   12 6  lacking  6 4  Ankle dorsiflexion  Ankle plantarflexion            Ankle inversion            Ankle eversion            12/04/22: 4 d lacking for knee ext . 12/16/22=3d lacking for knee ext (Blank rows = not tested)  LOWER EXTREMITY MMT:  MMT Right eval Left eval  Hip flexion  4  Hip extension    Hip abduction    Hip adduction    Hip internal rotation    Hip external rotation    Knee flexion    Knee extension    Ankle dorsiflexion    Ankle plantarflexion    Ankle inversion    Ankle eversion     (Blank rows = not tested); Lt knee MMT deferred due to post-op acuity   LOWER EXTREMITY SPECIAL TESTS:  N/A  FUNCTIONAL TESTS:  TUG 17.3 seconds with SPC  GAIT: Distance walked: 10 ft  Assistive device utilized: Single point cane Level of assistance: Modified independence Comments: limited knee flexion/extension during stance and swing on LLE, limited push-off LLE, slow gait speed.  [facility] Adult PT  Treatment:                                                DATE: 12/22/22 Therapeutic Exercise: Rec bike x 8 min  40 Lb Single leg leg press  x 10 4 inch step down/retro step up  10 x 2 - needs 1 UE  Neuromuscular re-ed: 30 sec SLS  Therapeutic Activity: Negotiating stairs, without UE to rise, needs UE for safety on descent.      [facility] Adult PT Treatment:                                                DATE: 12/16/22 Therapeutic Exercise: Rec Bike L4 4 mins  Standing TKE c ball press x15 5" Wall slides 45d 2x10 c ball squeeze TKE with GTB 2x10 3" Prone knee hang 5# x 4 minutes Walking c intentional quad control Seated hamstring stretch passive x3 30' SLR with initial Quad Set 10 x 2, min lag LAQ  5 sec x 10 Standing H/S curls 10 x 2 4# 4 inch step down 3 x 10   [facility] Adult PT Treatment:                                                DATE: 12/10/22 Therapeutic Exercise: Rec Bike L2  Prone knee hang 3# x  3 minutes Prone QS 5 sec x 10 with 3# on posterior knee  Supine hamstring stretch passive Supine QS 5 sec x 10 SLR with initial Quad Set 10 x 2  LAQ 90 deg isometric using Exercise ball 5 sec x 10 STS x 10 H/s curls 10 x 2 green band  4 inch step down 3 x 10   [facility] Adult PT Treatment:  DATE: 12/08/2022 Therapeutic Exercise: Seated hamstring stretch 2 x 30 sec Step down 3 x 10  LLE with 4 inch step SLR 2 x 10 Standing hip abd/ extension with RTB 2 x 12 with RTB Seated hamstring curl 3 x 10 with RTB   Updated HEP for standing hip strengthening, SLR Manual Therapy: MTPR along the hamstring Tack and stretch of the L hamstring   PATIENT EDUCATION:  Education details: see treatment  Person educated: Patient Education method: Explanation, Demonstration, Tactile cues, Verbal cues, and Handouts Education comprehension: verbalized understanding, returned demonstration, verbal cues required, tactile cues required, and needs further  education  HOME EXERCISE PROGRAM: Access Code: [email or web address] URL: [email or web address] Date: 12/08/2022 Prepared by: Lulu Riding  Exercises - Supine Heel Slide  - 2 x daily - 7 x weekly - 1 sets - 10 reps - 5 sec  hold - Supine Quad Set  - 2 x daily - 7 x weekly - 2 sets - 10 reps - 5 sec  hold - Long Sitting Calf Stretch with Strap  - 2 x daily - 7 x weekly - 3 sets - 30 sec  hold - Seated Hamstring Stretch  - 2 x daily - 7 x weekly - 3 sets - 30 sec  hold - Seated Knee Flexion AAROM  - 2 x daily - 7 x weekly - 1 sets - 10 reps - 5 sec  hold - Sidelying Hip Abduction (Mirrored)  - 1 x daily - 7 x weekly - 3 sets - 15 reps - Prone Knee Flexion  - 1 x daily - 7 x weekly - 2 sets - 15 reps - Standing Terminal Knee Extension at Wall with Ball  - 1 x daily - 7 x weekly - 2 sets - 10 reps - 5 seconds hold - SLR  - 1 x daily - 7 x weekly - 3 sets - 15 reps - 1 hold - Standing Hip Extension Kicks  - 1 x daily - 7 x weekly - 2 sets - 10 reps - Standing Hip Abduction with Resistance at Ankles and Counter Support  - 1 x daily - 7 x weekly - 2 sets - 10 reps  ASSESSMENT:  CLINICAL IMPRESSION: 12/22/22 : PT was completed with emphasis on functional strengthening and single leg stability. Pt demonstrates decreased eccentric quad control descending stairs, requiring UE assist for safety. Single leg stand limited to 30 seconds. Pt reports she had to squat down to retrieve an item from low fridge and had difficulty returning upright due to strength deficit. Pt fatigue with non weighted squat taps to chair but able to perform with good mechanics. With 15# weighted squat, pt requires cues to prevent weight shift to non surgical leg as well as due for correct hip hinge. She will benefit from skilled PT to address the above stated deficits in order to return to optimal function and return work lifting up to 50 pounds.    EVALUATION: Patient is a 32 y.o. female who was seen today for physical  therapy evaluation and treatment for s/p Lt ACL reconstruction with FGL graft-link on 10/16/22. She demonstrates ROM, strength, gait and balance deficits that are consistent with her recent post-operative status. She was encouraged to continue to wear her brace due to poor quad activation with patient verbalizing understanding. She will benefit from skilled PT to address the above stated deficits in order to return to optimal function.   OBJECTIVE IMPAIRMENTS: Abnormal gait, decreased activity tolerance, decreased balance, decreased endurance,  decreased knowledge of condition, difficulty walking, decreased ROM, decreased strength, improper body mechanics, postural dysfunction, and pain.   ACTIVITY LIMITATIONS: carrying, lifting, bending, standing, squatting, stairs, transfers, and locomotion level  PARTICIPATION LIMITATIONS: meal prep, cleaning, laundry, driving, shopping, community activity, and occupation  PERSONAL FACTORS: Age, Fitness, Profession, and Time since onset of injury/illness/exacerbation are also affecting patient's functional outcome.   REHAB POTENTIAL: Good  CLINICAL DECISION MAKING: Stable/uncomplicated  EVALUATION COMPLEXITY: Low   GOALS: Goals reviewed with patient? Yes  SHORT TERM GOALS: Target date: 12/15/2022   Patient will be independent and compliant with initial HEP.   Baseline: issued at eval.  Goal status: MET  2.  Patient will perform SLR without quad lag to improve gait stability.  Baseline: see above 11/24/22: min lag present  12/22/22: slight quad lag  Goal status: ONGOING  3.  Patient will demonstrate at least 90 degrees of Lt knee flexion AROM to improve ability to complete sit to stand.   Baseline: see above Goal status: MET  4.  Patient will demonstrate full Lt knee extension AROM to improve gait mechanics.  Baseline: see above 11/24/22: lacks 3-5 degrees passively  12/22/22: slight quad lag present Goal status: ONGOING  5. Patient will report  pain at worst rated as </=5/10 to reduce current functional limitations.   Baseline: see above  12/10/22: 7-8/10 at night  Goal Status: ONGOING   LONG TERM GOALS: Target date: 01/31/2023    Patient will demonstrate 5/5 Lt knee strength to improve stability with stair and curb negotiation.  Baseline: deferred due to post-op acuity Goal status: ONGOING  2.  Patient will demonstrate at least 120 degrees of Lt knee flexion AROM to improve ability to complete bending activity.  Baseline: see above Goal status: MET  3.  Patient will demonstrate normalized and pain free squat mechanics to improve ability to complete lifting activity at work.  Baseline: unable 12/22/22: not returned to work yet, needs to work on squat mechanics and strengthening, needs to lift 50#  Goal status: ONGOING  4.  Patient will ambulate community distances without AD with </=2/10 pain Baseline: SPC, increased pain with prolonged walking.  12/22/22: uses as needed , mostly not using  Goal status: MET  5.  Patient will be able to ascend/descend stairs with reciprocal pattern without UE support Baseline: step to pattern, utilizes handrail and [facility] 12/22/22: hesitancy on descent , lacks eccentric control, needs 1 UE Goal status: ONGOING  6.  Patient will score at least 61% on FOTO to signify clinically meaningful improvement in functional abilities.  Baseline: 30 11/26/22: 57 Goal status: ONGOING   PLAN:  PT FREQUENCY: 2x/week  PT DURATION: 12 weeks  PLANNED INTERVENTIONS: Therapeutic exercises, Therapeutic activity, Neuromuscular re-education, Balance training, Gait training, Patient/Family education, Self Care, Joint mobilization, Dry Needling, Electrical stimulation, Cryotherapy, Moist heat, Vasopneumatic device, Manual therapy, and Re-evaluation  PLAN FOR NEXT SESSION: review and progress HEP; quad strengthening, gait training. Knee ROM   Jannette Spanner, PTA 12/22/22 2:09 PM Phone: [phone] Fax:  [phone]   Check all possible CPT codes: 42595 - PT Re-evaluation, 97110- Therapeutic Exercise, [id number]- Neuro Re-education, [phone] - Gait Training, [phone] - Manual Therapy, 97530 - Therapeutic Activities, 97535 - Self Care, [phone] - Electrical stimulation (Manual), and [id number] - Vaso                        Check all conditions that are expected to impact treatment: {Conditions expected to impact treatment:None of these  apply        If treatment provided at initial evaluation, no treatment charged due to lack of authorization.

## 2022-12-24 ENCOUNTER — Ambulatory Visit: Payer: Commercial Managed Care - HMO | Admitting: Physical Therapy

## 2022-12-24 ENCOUNTER — Encounter: Payer: Self-pay | Admitting: Physical Therapy

## 2022-12-24 DIAGNOSIS — M25562 Pain in left knee: Secondary | ICD-10-CM

## 2022-12-24 DIAGNOSIS — R2689 Other abnormalities of gait and mobility: Secondary | ICD-10-CM

## 2022-12-24 DIAGNOSIS — M6281 Muscle weakness (generalized): Secondary | ICD-10-CM

## 2022-12-24 DIAGNOSIS — R600 Localized edema: Secondary | ICD-10-CM

## 2022-12-24 NOTE — Therapy (Signed)
OUTPATIENT PHYSICAL THERAPY LOWER EXTREMITY TREATMENT   Patient Name: Kathleen Duffy MRN: [id number] DOB:Apr 16, 1990, 32 y.o., female Today's Date: 12/24/2022  END OF SESSION:  PT End of Session - 12/24/22 1325     Visit Number 14    Number of Visits 25    Date for PT Re-Evaluation 01/31/23    Authorization Type [employer]    Authorization Time Period 12/23/22-02/20/23    Authorization - Visit Number 1    Authorization - Number of Visits 6    PT Start Time 1323    PT Stop Time 1401    PT Time Calculation (min) 38 min                     Past Medical History:  Diagnosis Date   Migraines    Past Surgical History:  Procedure Laterality Date   ANTERIOR CRUCIATE LIGAMENT REPAIR Right    There are no problems to display for this patient.   PCP: none   REFERRING PROVIDER: Armida Sans, PA-C  REFERRING DIAG: s/p ACL reconstruction 10/16/2022  THERAPY DIAG:  Acute pain of left knee  Other abnormalities of gait and mobility  Muscle weakness (generalized)  Localized edema  Rationale for Evaluation and Treatment: Rehabilitation  ONSET DATE: 10/16/22  SUBJECTIVE:   SUBJECTIVE STATEMENT:  I was sore in my quads after last session.   PERTINENT HISTORY: Lt ACLR 10/16/22 PAIN:  Are you having pain? Yes: NPRS scale: 0/10 currently  Pain location: Lt anterior knee  Pain description: ache  Aggravating factors: sleep positioning,prolonged walking Relieving factors: elevation  PRECAUTIONS: Knee and Fall   WEIGHT BEARING RESTRICTIONS: Yes WBAT  FALLS:  Has patient fallen in last 6 months? Yes. 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, push/pull cart)    PLOF: Independent  PATIENT GOALS: "go back to work."  NEXT MD VISIT: September 2024 , OCT 9   OBJECTIVE:   DIAGNOSTIC FINDINGS: none on file   PATIENT SURVEYS:  FOTO 30% function to 61% predicted  11/26/22: 57%  12/08/22:  50%  COGNITION: Overall cognitive status: Within functional limits for tasks assessed     SENSATION: Not tested  EDEMA:  Mild swelling about Lt knee   MUSCLE LENGTH: Not assessed   POSTURE: maintains Lt knee in flexion in standing   PALPATION: Diffuse tenderness about Lt anterior knee   LOWER EXTREMITY ROM:  Active ROM Right eval Left eval Left 11/06/2022 Left  11/10/22 Left 11/13/2022 Left 11/18/2022 LT 11/21/22 11/24/22 LT 12/08/2022 LT 12/22/22 LT  Hip flexion            Hip extension            Hip abduction            Hip adduction            Hip internal rotation            Hip external rotation            Knee flexion  65 87 102 110 110 117 115 124 125  Knee extension  Lacking 6 16   12 6  lacking  6 4  Ankle dorsiflexion  Ankle plantarflexion            Ankle inversion            Ankle eversion            12/04/22: 4 d lacking for knee ext . 12/16/22=3d lacking for knee ext (Blank rows = not tested)  LOWER EXTREMITY MMT:  MMT Right eval Left eval  Hip flexion  4  Hip extension    Hip abduction    Hip adduction    Hip internal rotation    Hip external rotation    Knee flexion    Knee extension    Ankle dorsiflexion    Ankle plantarflexion    Ankle inversion    Ankle eversion     (Blank rows = not tested); Lt knee MMT deferred due to post-op acuity   LOWER EXTREMITY SPECIAL TESTS:  N/A  FUNCTIONAL TESTS:  TUG 17.3 seconds with SPC  GAIT: Distance walked: 10 ft  Assistive device utilized: Single point cane Level of assistance: Modified independence Comments: limited knee flexion/extension during stance and swing on LLE, limited push-off LLE, slow gait speed.  [facility] Adult PT Treatment:                                                 DATE: 12/24/22 Therapeutic Exercise: Elliptical Ramp 1 Resist 3 x 6 minutes forward  Wall slide  Left 8 inch step up with March Lateral lunge- mod cues RDL left - light touch on counter  Rebounder toss Left SLS  L SLS on Airex  Squat tap to chair  S/L squat to chair x 10-elevated seat     [facility] Adult PT Treatment:                                                DATE: 12/22/22 Therapeutic Exercise: Rec bike x 8 min  40 Lb Single leg leg press  x 10 4 inch step down/retro step up  10 x 2 - needs 1 UE Squat tap to chair   Neuromuscular re-ed: 30 sec SLS  Therapeutic Activity: Negotiating stairs, without UE to rise, needs UE for safety on descent.      [facility] Adult PT Treatment:                                                DATE: 12/16/22 Therapeutic Exercise: Rec Bike L4 4 mins  Standing TKE c ball press x15 5" Wall slides 45d 2x10 c ball squeeze TKE with GTB 2x10 3" Prone knee hang 5# x 4 minutes Walking c intentional quad control Seated hamstring stretch passive x3 30' SLR with initial Quad Set 10 x 2, min lag LAQ  5 sec x 10 Standing H/S curls 10 x 2 4# 4 inch step down 3 x 10   [facility] Adult PT Treatment:  DATE: 12/10/22 Therapeutic Exercise: Rec Bike L2  Prone knee hang 3# x  3 minutes Prone QS 5 sec x 10 with 3# on posterior knee  Supine hamstring stretch passive Supine QS 5 sec x 10 SLR with initial Quad Set 10 x 2  LAQ 90 deg isometric using Exercise ball 5 sec x 10 STS x 10 H/s curls 10 x 2 green band  4 inch step down 3 x 10     PATIENT EDUCATION:  Education details: see treatment  Person educated: Patient Education method: Explanation, Demonstration, Tactile cues, Verbal cues, and Handouts Education comprehension: verbalized understanding, returned demonstration, verbal cues required, tactile cues required, and needs further education  HOME EXERCISE PROGRAM: Access Code:  [email or web address] URL: [email or web address] Date: 12/08/2022 Prepared by: Lulu Riding  Exercises - Supine Heel Slide  - 2 x daily - 7 x weekly - 1 sets - 10 reps - 5 sec  hold - Supine Quad Set  - 2 x daily - 7 x weekly - 2 sets - 10 reps - 5 sec  hold - Long Sitting Calf Stretch with Strap  - 2 x daily - 7 x weekly - 3 sets - 30 sec  hold - Seated Hamstring Stretch  - 2 x daily - 7 x weekly - 3 sets - 30 sec  hold - Seated Knee Flexion AAROM  - 2 x daily - 7 x weekly - 1 sets - 10 reps - 5 sec  hold - Sidelying Hip Abduction (Mirrored)  - 1 x daily - 7 x weekly - 3 sets - 15 reps - Prone Knee Flexion  - 1 x daily - 7 x weekly - 2 sets - 15 reps - Standing Terminal Knee Extension at Wall with Ball  - 1 x daily - 7 x weekly - 2 sets - 10 reps - 5 seconds hold - SLR  - 1 x daily - 7 x weekly - 3 sets - 15 reps - 1 hold - Standing Hip Extension Kicks  - 1 x daily - 7 x weekly - 2 sets - 10 reps - Standing Hip Abduction with Resistance at Ankles and Counter Support  - 1 x daily - 7 x weekly - 2 sets - 10 reps  - Squat with Chair Touch  - 1 x daily - 7 x weekly - 2 sets - 10 reps - Single leg dead lit- use counter support  - 1 x daily - 7 x weekly - 2 sets - 10 reps - Single Leg Stance on Foam Pad  - 1 x daily - 7 x weekly - 1 sets - 3 reps - 30 hold  ASSESSMENT:  CLINICAL IMPRESSION: 12/22/22 : PT was completed with emphasis on strengthening and single leg stability. Expanded HEP to include more closed chain. Pt is nearly 9 weeks post op and will see MD for follow up today. She will benefit from skilled PT to address the above stated deficits in order to return to optimal function and return work lifting up to 50 pounds.    EVALUATION: Patient is a 32 y.o. female who was seen today for physical therapy evaluation and treatment for s/p Lt ACL reconstruction with FGL graft-link on 10/16/22. She demonstrates ROM, strength, gait and balance deficits that are consistent with her recent  post-operative status. She was encouraged to continue to wear her brace due to poor quad activation with patient verbalizing understanding. She will benefit from skilled PT to address the above stated deficits in  order to return to optimal function.   OBJECTIVE IMPAIRMENTS: Abnormal gait, decreased activity tolerance, decreased balance, decreased endurance, decreased knowledge of condition, difficulty walking, decreased ROM, decreased strength, improper body mechanics, postural dysfunction, and pain.   ACTIVITY LIMITATIONS: carrying, lifting, bending, standing, squatting, stairs, transfers, and locomotion level  PARTICIPATION LIMITATIONS: meal prep, cleaning, laundry, driving, shopping, community activity, and occupation  PERSONAL FACTORS: Age, Fitness, Profession, and Time since onset of injury/illness/exacerbation are also affecting patient's functional outcome.   REHAB POTENTIAL: Good  CLINICAL DECISION MAKING: Stable/uncomplicated  EVALUATION COMPLEXITY: Low   GOALS: Goals reviewed with patient? Yes  SHORT TERM GOALS: Target date: 12/15/2022   Patient will be independent and compliant with initial HEP.   Baseline: issued at eval.  Goal status: MET  2.  Patient will perform SLR without quad lag to improve gait stability.  Baseline: see above 11/24/22: min lag present  12/22/22: slight quad lag  Goal status: ONGOING  3.  Patient will demonstrate at least 90 degrees of Lt knee flexion AROM to improve ability to complete sit to stand.   Baseline: see above Goal status: MET  4.  Patient will demonstrate full Lt knee extension AROM to improve gait mechanics.  Baseline: see above 11/24/22: lacks 3-5 degrees passively  12/22/22: slight quad lag present Goal status: ONGOING  5. Patient will report pain at worst rated as </=5/10 to reduce current functional limitations.   Baseline: see above  12/10/22: 7-8/10 at night  Goal Status: ONGOING   LONG TERM GOALS: Target date:  01/31/2023    Patient will demonstrate 5/5 Lt knee strength to improve stability with stair and curb negotiation.  Baseline: deferred due to post-op acuity Goal status: ONGOING  2.  Patient will demonstrate at least 120 degrees of Lt knee flexion AROM to improve ability to complete bending activity.  Baseline: see above Goal status: MET  3.  Patient will demonstrate normalized and pain free squat mechanics to improve ability to complete lifting activity at work.  Baseline: unable 12/22/22: not returned to work yet, needs to work on squat mechanics and strengthening, needs to lift 50#  Goal status: ONGOING  4.  Patient will ambulate community distances without AD with </=2/10 pain Baseline: SPC, increased pain with prolonged walking.  12/22/22: uses as needed , mostly not using  Goal status: MET  5.  Patient will be able to ascend/descend stairs with reciprocal pattern without UE support Baseline: step to pattern, utilizes handrail and [facility] 12/22/22: hesitancy on descent , lacks eccentric control, needs 1 UE Goal status: ONGOING  6.  Patient will score at least 61% on FOTO to signify clinically meaningful improvement in functional abilities.  Baseline: 30 11/26/22: 57 Goal status: ONGOING   PLAN:  PT FREQUENCY: 2x/week  PT DURATION: 12 weeks  PLANNED INTERVENTIONS: Therapeutic exercises, Therapeutic activity, Neuromuscular re-education, Balance training, Gait training, Patient/Family education, Self Care, Joint mobilization, Dry Needling, Electrical stimulation, Cryotherapy, Moist heat, Vasopneumatic device, Manual therapy, and Re-evaluation  PLAN FOR NEXT SESSION: review and progress HEP; quad strengthening, gait training. Knee ROM   Jannette Spanner, PTA 12/24/22 2:35 PM Phone: [phone] Fax: [phone]   Check all possible CPT codes: 29562 - PT Re-evaluation, 97110- Therapeutic Exercise, [phone]- Neuro Re-education, [phone] - Gait Training, 97140 - Manual Therapy, 97530 -  Therapeutic Activities, 97535 - Self Care, [phone] - Electrical stimulation (Manual), and 97016 - Vaso  Check all conditions that are expected to impact treatment: {Conditions expected to impact treatment:None of these apply        If treatment provided at initial evaluation, no treatment charged due to lack of authorization.

## 2022-12-29 ENCOUNTER — Encounter: Payer: Self-pay | Admitting: Physical Therapy

## 2022-12-29 ENCOUNTER — Ambulatory Visit: Payer: Commercial Managed Care - HMO | Admitting: Physical Therapy

## 2022-12-29 DIAGNOSIS — M25562 Pain in left knee: Secondary | ICD-10-CM

## 2022-12-29 DIAGNOSIS — R2689 Other abnormalities of gait and mobility: Secondary | ICD-10-CM

## 2022-12-29 NOTE — Therapy (Signed)
OUTPATIENT PHYSICAL THERAPY LOWER EXTREMITY TREATMENT   Patient Name: Kathleen Duffy MRN: [id number] DOB:08-16-1990, 32 y.o., female Today's Date: 12/29/2022  END OF SESSION:  PT End of Session - 12/29/22 1155     Visit Number 15    Number of Visits 25    Date for PT Re-Evaluation 01/31/23    Authorization Type [employer]    Authorization Time Period 12/23/22-02/20/23    Authorization - Visit Number 2    Authorization - Number of Visits 6    PT Start Time 1148    PT Stop Time 1226    PT Time Calculation (min) 38 min                     Past Medical History:  Diagnosis Date   Migraines    Past Surgical History:  Procedure Laterality Date   ANTERIOR CRUCIATE LIGAMENT REPAIR Right    There are no problems to display for this patient.   PCP: none   REFERRING PROVIDER: Armida Sans, PA-C  REFERRING DIAG: s/p ACL reconstruction 10/16/2022  THERAPY DIAG:  Acute pain of left knee  Other abnormalities of gait and mobility  Rationale for Evaluation and Treatment: Rehabilitation  ONSET DATE: 10/16/22  SUBJECTIVE:   SUBJECTIVE STATEMENT:  My legs were sore last time but I am good today. No pain. Working on [employer].   PERTINENT HISTORY: Lt ACLR 10/16/22 PAIN:  Are you having pain? Yes: NPRS scale: 0/10 currently  Pain location: Lt anterior knee  Pain description: ache  Aggravating factors: sleep positioning,prolonged walking Relieving factors: elevation  PRECAUTIONS: Knee and Fall   WEIGHT BEARING RESTRICTIONS: Yes WBAT  FALLS:  Has patient fallen in last 6 months? Yes. 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, push/pull cart)   PLOF:  Independent  PATIENT GOALS: "go back to work."  NEXT MD VISIT: September 2024 , OCT 9   OBJECTIVE:   DIAGNOSTIC FINDINGS: none on file   PATIENT SURVEYS:  FOTO 30% function to 61% predicted  11/26/22: 57%  12/08/22:  50%  COGNITION: Overall cognitive status: Within functional limits for tasks assessed     SENSATION: Not tested  EDEMA:  Mild swelling about Lt knee   MUSCLE LENGTH: Not assessed   POSTURE: maintains Lt knee in flexion in standing   PALPATION: Diffuse tenderness about Lt anterior knee   LOWER EXTREMITY ROM:  Active ROM Right eval Left eval Left 11/06/2022 Left  11/10/22 Left 11/13/2022 Left 11/18/2022 LT 11/21/22 11/24/22 LT 12/08/2022 LT 12/22/22 LT  Hip flexion            Hip extension            Hip abduction            Hip adduction            Hip internal rotation            Hip external rotation            Knee flexion  65 87 102 110 110 117 115 124 125  Knee extension  Lacking 6 16   12 6  lacking  6 4  Ankle dorsiflexion  Ankle plantarflexion            Ankle inversion            Ankle eversion            12/04/22: 4 d lacking for knee ext . 12/16/22=3d lacking for knee ext (Blank rows = not tested)  LOWER EXTREMITY MMT:  MMT Right eval Left eval  Hip flexion  4  Hip extension    Hip abduction    Hip adduction    Hip internal rotation    Hip external rotation    Knee flexion    Knee extension    Ankle dorsiflexion    Ankle plantarflexion    Ankle inversion    Ankle eversion     (Blank rows = not tested); Lt knee MMT deferred due to post-op acuity   LOWER EXTREMITY SPECIAL TESTS:  N/A  FUNCTIONAL TESTS:  TUG 17.3 seconds with SPC  GAIT: Distance walked: 10 ft  Assistive device utilized: Single point cane Level of assistance: Modified independence Comments: limited knee flexion/extension during stance and swing on LLE, limited push-off LLE, slow gait speed.  [facility] Adult PT Treatment:                                                 DATE: 12/29/22 Therapeutic Exercise: Elliptical  Lateral lunges TRX squats  SLS on Foam oval re bounder toss  RDL 10# - mod cues L x 12  10# STS S/L squat to chair  Step up with march Front step down - heel strike x 10 80lb bilateral leg press CYBEX x 10 40 Lb single leg press  x 10    [facility] Adult PT Treatment:                                                DATE: 12/24/22 Therapeutic Exercise: Elliptical Ramp 1 Resist 3 x 6 minutes forward  Wall slide  Left 8 inch step up with March Lateral lunge- mod cues RDL left - light touch on counter  Rebounder toss Left SLS  L SLS on Airex  Squat tap to chair  S/L squat to chair x 10-elevated seat     [facility] Adult PT Treatment:                                                DATE: 12/22/22 Therapeutic Exercise: Rec bike x 8 min  40 Lb Single leg leg press  x 10 4 inch step down/retro step up  10 x 2 - needs 1 UE Squat tap to chair   Neuromuscular re-ed: 30 sec SLS  Therapeutic Activity: Negotiating stairs, without UE to rise, needs UE for safety on descent.      [facility] Adult PT Treatment:                                                DATE: 12/16/22 Therapeutic Exercise: Rec Bike L4 4  mins  Standing TKE c ball press x15 5" Wall slides 45d 2x10 c ball squeeze TKE with GTB 2x10 3" Prone knee hang 5# x 4 minutes Walking c intentional quad control Seated hamstring stretch passive x3 30' SLR with initial Quad Set 10 x 2, min lag LAQ  5 sec x 10 Standing H/S curls 10 x 2 4# 4 inch step down 3 x 10   [facility] Adult PT Treatment:                                                DATE: 12/10/22 Therapeutic Exercise: Rec Bike L2  Prone knee hang 3# x  3 minutes Prone QS 5 sec x 10 with 3# on posterior knee  Supine hamstring stretch passive Supine QS 5 sec x 10 SLR with initial Quad Set 10 x 2  LAQ 90 deg isometric using Exercise ball 5 sec x 10 STS x 10 H/s curls 10 x 2 green band  4 inch step down 3 x  10     PATIENT EDUCATION:  Education details: see treatment  Person educated: Patient Education method: Explanation, Demonstration, Tactile cues, Verbal cues, and Handouts Education comprehension: verbalized understanding, returned demonstration, verbal cues required, tactile cues required, and needs further education  HOME EXERCISE PROGRAM: Access Code: [email or web address] URL: [email or web address] Date: 12/08/2022 Prepared by: Lulu Riding  Exercises - Supine Heel Slide  - 2 x daily - 7 x weekly - 1 sets - 10 reps - 5 sec  hold - Supine Quad Set  - 2 x daily - 7 x weekly - 2 sets - 10 reps - 5 sec  hold - Long Sitting Calf Stretch with Strap  - 2 x daily - 7 x weekly - 3 sets - 30 sec  hold - Seated Hamstring Stretch  - 2 x daily - 7 x weekly - 3 sets - 30 sec  hold - Seated Knee Flexion AAROM  - 2 x daily - 7 x weekly - 1 sets - 10 reps - 5 sec  hold - Sidelying Hip Abduction (Mirrored)  - 1 x daily - 7 x weekly - 3 sets - 15 reps - Prone Knee Flexion  - 1 x daily - 7 x weekly - 2 sets - 15 reps - Standing Terminal Knee Extension at Wall with Ball  - 1 x daily - 7 x weekly - 2 sets - 10 reps - 5 seconds hold - SLR  - 1 x daily - 7 x weekly - 3 sets - 15 reps - 1 hold - Standing Hip Extension Kicks  - 1 x daily - 7 x weekly - 2 sets - 10 reps - Standing Hip Abduction with Resistance at Ankles and Counter Support  - 1 x daily - 7 x weekly - 2 sets - 10 reps  - Squat with Chair Touch  - 1 x daily - 7 x weekly - 2 sets - 10 reps - Single leg dead lift- use counter support  - 1 x daily - 7 x weekly - 2 sets - 10 reps - Single Leg Stance on Foam Pad  - 1 x daily - 7 x weekly - 1 sets - 3 reps - 30 hold  ASSESSMENT:  CLINICAL IMPRESSION: Saw MD last week. He said to keep going to PT. Scheduled one more follow up right before  RTW date of 01/16/23. Continued with closed chain quad and LE strength, working on tolerance for RTW. Discussed progressive walking program as pt will be  working 12 hours shifts on her feet when she returns. Discussed also considering a start back on a shorter shift.    12/22/22 : PT was completed with emphasis on strengthening and single leg stability. Expanded HEP to include more closed chain. Pt is nearly 9 weeks post op and will see MD for follow up today. She will benefit from skilled PT to address the above stated deficits in order to return to optimal function and return work lifting up to 50 pounds.    EVALUATION: Patient is a 32 y.o. female who was seen today for physical therapy evaluation and treatment for s/p Lt ACL reconstruction with FGL graft-link on 10/16/22. She demonstrates ROM, strength, gait and balance deficits that are consistent with her recent post-operative status. She was encouraged to continue to wear her brace due to poor quad activation with patient verbalizing understanding. She will benefit from skilled PT to address the above stated deficits in order to return to optimal function.   OBJECTIVE IMPAIRMENTS: Abnormal gait, decreased activity tolerance, decreased balance, decreased endurance, decreased knowledge of condition, difficulty walking, decreased ROM, decreased strength, improper body mechanics, postural dysfunction, and pain.   ACTIVITY LIMITATIONS: carrying, lifting, bending, standing, squatting, stairs, transfers, and locomotion level  PARTICIPATION LIMITATIONS: meal prep, cleaning, laundry, driving, shopping, community activity, and occupation  PERSONAL FACTORS: Age, Fitness, Profession, and Time since onset of injury/illness/exacerbation are also affecting patient's functional outcome.   REHAB POTENTIAL: Good  CLINICAL DECISION MAKING: Stable/uncomplicated  EVALUATION COMPLEXITY: Low   GOALS: Goals reviewed with patient? Yes  SHORT TERM GOALS: Target date: 12/15/2022   Patient will be independent and compliant with initial HEP.   Baseline: issued at eval.  Goal status: MET  2.  Patient will  perform SLR without quad lag to improve gait stability.  Baseline: see above 11/24/22: min lag present  12/22/22: slight quad lag  Goal status: ONGOING  3.  Patient will demonstrate at least 90 degrees of Lt knee flexion AROM to improve ability to complete sit to stand.   Baseline: see above Goal status: MET  4.  Patient will demonstrate full Lt knee extension AROM to improve gait mechanics.  Baseline: see above 11/24/22: lacks 3-5 degrees passively  12/22/22: slight quad lag present Goal status: ONGOING  5. Patient will report pain at worst rated as </=5/10 to reduce current functional limitations.   Baseline: see above  12/10/22: 7-8/10 at night  Goal Status: ONGOING   LONG TERM GOALS: Target date: 01/31/2023    Patient will demonstrate 5/5 Lt knee strength to improve stability with stair and curb negotiation.  Baseline: deferred due to post-op acuity Goal status: ONGOING  2.  Patient will demonstrate at least 120 degrees of Lt knee flexion AROM to improve ability to complete bending activity.  Baseline: see above Goal status: MET  3.  Patient will demonstrate normalized and pain free squat mechanics to improve ability to complete lifting activity at work.  Baseline: unable 12/22/22: not returned to work yet, needs to work on squat mechanics and strengthening, needs to lift 50#  Goal status: ONGOING  4.  Patient will ambulate community distances without AD with </=2/10 pain Baseline: SPC, increased pain with prolonged walking.  12/22/22: uses as needed , mostly not using  Goal status: MET  5.  Patient will be able to ascend/descend stairs with  reciprocal pattern without UE support Baseline: step to pattern, utilizes handrail and [facility] 12/22/22: hesitancy on descent , lacks eccentric control, needs 1 UE Goal status: ONGOING  6.  Patient will score at least 61% on FOTO to signify clinically meaningful improvement in functional abilities.  Baseline: 30 11/26/22: 57 Goal status:  ONGOING   PLAN:  PT FREQUENCY: 2x/week  PT DURATION: 12 weeks  PLANNED INTERVENTIONS: Therapeutic exercises, Therapeutic activity, Neuromuscular re-education, Balance training, Gait training, Patient/Family education, Self Care, Joint mobilization, Dry Needling, Electrical stimulation, Cryotherapy, Moist heat, Vasopneumatic device, Manual therapy, and Re-evaluation  PLAN FOR NEXT SESSION: review and progress HEP; quad strengthening, gait training. Knee ROM, lifting for RTW   Jannette Spanner, PTA 12/29/22 12:21 PM Phone: [phone] Fax: [phone]   Check all possible CPT codes: 57846 - PT Re-evaluation, 97110- Therapeutic Exercise, [phone]- Neuro Re-education, [phone] - Gait Training, [phone] - Manual Therapy, 97530 - Therapeutic Activities, 97535 - Self Care, [phone] - Electrical stimulation (Manual), and [id number] - Vaso                        Check all conditions that are expected to impact treatment: {Conditions expected to impact treatment:None of these apply        If treatment provided at initial evaluation, no treatment charged due to lack of authorization.

## 2023-01-01 ENCOUNTER — Ambulatory Visit: Payer: Commercial Managed Care - HMO

## 2023-01-01 DIAGNOSIS — R600 Localized edema: Secondary | ICD-10-CM

## 2023-01-01 DIAGNOSIS — R2689 Other abnormalities of gait and mobility: Secondary | ICD-10-CM

## 2023-01-01 DIAGNOSIS — M6281 Muscle weakness (generalized): Secondary | ICD-10-CM

## 2023-01-01 DIAGNOSIS — M25562 Pain in left knee: Secondary | ICD-10-CM

## 2023-01-01 NOTE — Therapy (Addendum)
 OUTPATIENT PHYSICAL THERAPY LOWER EXTREMITY TREATMENT/DC   Patient Name: Kathleen Duffy MRN: [id number] DOB:January 03, 1991, 32 y.o., female Today's Date: 01/01/2023  END OF SESSION:  PT End of Session - 01/01/23 1341     Visit Number 16    Number of Visits 25    Date for PT Re-Evaluation 01/31/23    Authorization Type [employer]    Authorization Time Period 12/23/22-02/20/23    Authorization - Visit Number 3    Authorization - Number of Visits 6    PT Start Time 1335    PT Stop Time 1420    PT Time Calculation (min) 45 min    Activity Tolerance Patient tolerated treatment well    Behavior During Therapy WFL for tasks assessed/performed                      Past Medical History:  Diagnosis Date   Migraines    Past Surgical History:  Procedure Laterality Date   ANTERIOR CRUCIATE LIGAMENT REPAIR Right    There are no problems to display for this patient.   PCP: none   REFERRING PROVIDER: Delores Army POUR, PA-C  REFERRING DIAG: s/p ACL reconstruction 10/16/2022  THERAPY DIAG:  Acute pain of left knee  Other abnormalities of gait and mobility  Muscle weakness (generalized)  Localized edema  Rationale for Evaluation and Treatment: Rehabilitation  ONSET DATE: 10/16/22  SUBJECTIVE:   SUBJECTIVE STATEMENT:  My L leg is continuing to feel stronger.   PERTINENT HISTORY: Lt ACLR 10/16/22 PAIN:  Are you having pain? Yes: NPRS scale: 0/10 currently  Pain location: Lt anterior knee  Pain description: ache  Aggravating factors: sleep positioning,prolonged walking Relieving factors: elevation  PRECAUTIONS: Knee and Fall   WEIGHT BEARING RESTRICTIONS: Yes WBAT  FALLS:  Has patient fallen in last 6 months? Yes. 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  NEXT MD VISIT: September 2024 , OCT 9   OBJECTIVE:   DIAGNOSTIC FINDINGS: none on file   PATIENT SURVEYS:  FOTO 30% function to 61% predicted  11/26/22: 57%  12/08/22:  50%  COGNITION: Overall cognitive status: Within functional limits for tasks assessed     SENSATION: Not tested  EDEMA:  Mild swelling about Lt knee   MUSCLE LENGTH: Not assessed   POSTURE: maintains Lt knee in flexion in standing   PALPATION: Diffuse tenderness about Lt anterior knee   LOWER EXTREMITY ROM:  Active ROM Right eval Left eval Left 11/06/2022 Left  11/10/22 Left 11/13/2022 Left 11/18/2022 LT 11/21/22 11/24/22 LT 12/08/2022 LT 12/22/22 LT  Hip flexion            Hip extension            Hip abduction            Hip adduction            Hip internal rotation            Hip external rotation            Knee flexion  65 87 102 110 110 117 115 124 125  Knee extension  Lacking 6 16   12 6  lacking  6 4  Ankle dorsiflexion            Ankle plantarflexion            Ankle inversion            Ankle eversion            12/04/22: 4 d lacking for knee ext . 12/16/22=3d lacking for knee ext (Blank rows = not tested)  LOWER EXTREMITY MMT:  MMT Right eval Left eval  Hip flexion  4  Hip extension    Hip abduction    Hip adduction    Hip internal rotation    Hip external rotation    Knee flexion    Knee extension    Ankle dorsiflexion    Ankle plantarflexion    Ankle inversion    Ankle eversion     (Blank rows = not tested); Lt knee MMT deferred due to post-op acuity   LOWER EXTREMITY SPECIAL TESTS:  N/A  FUNCTIONAL TESTS:  TUG 17.3 seconds with SPC  GAIT: Distance walked: 10 ft  Assistive device utilized: Single point cane Level of assistance: Modified independence Comments: limited knee  flexion/extension during stance and swing on LLE, limited push-off LLE, slow gait speed.  [facility] Adult PT Treatment:                                                DATE: 01/01/23 Therapeutic Exercise: Elliptical  Lateral lunges x15 each TRX squats x15 TKE 2x15 Banded side steps 20'x3 GTB SLS on Foam oval re bounder toss  Front lateral step downs - heel strike x 10 each 6' 100 lb bilateral leg press CYBEX x 10 50 Lb single leg press 2 x 10  [facility] Adult PT Treatment:                                                DATE: 12/29/22 Therapeutic Exercise: Elliptical  Lateral lunges TRX squats  SLS on Foam oval re bounder toss  RDL 10# - mod cues L x 12  10# STS S/L squat to chair  Step up with march Front step down - heel strike x 10 80lb bilateral leg press CYBEX x 10 40 Lb single leg press  x 10   [facility] Adult PT Treatment:                                                DATE: 12/24/22 Therapeutic Exercise: Elliptical Ramp 1 Resist 3 x 6 minutes forward  Wall slide  Left 8 inch step up with March Lateral lunge- mod cues RDL left - light touch on counter  Rebounder toss Left SLS  L SLS on Airex  Squat tap to chair  S/L squat to chair x 10-elevated seat  [facility] Adult PT Treatment:  DATE: 12/22/22 Therapeutic Exercise: Rec bike x 8 min  40 Lb Single leg leg press  x 10 4 inch step down/retro step up  10 x 2 - needs 1 UE Squat tap to chair   Neuromuscular re-ed: 30 sec SLS  Therapeutic Activity: Negotiating stairs, without UE to rise, needs UE for safety on descent.   PATIENT EDUCATION:  Education details: see treatment  Person educated: Patient Education method: Explanation, Demonstration, Tactile cues, Verbal cues, and Handouts Education comprehension: verbalized understanding, returned demonstration, verbal cues required, tactile cues required, and needs further education  HOME EXERCISE PROGRAM: Access Code: [email or web address] URL:  [email or web address] Date: 12/08/2022 Prepared by: Joneen Fresh  Exercises - Supine Heel Slide  - 2 x daily - 7 x weekly - 1 sets - 10 reps - 5 sec  hold - Supine Quad Set  - 2 x daily - 7 x weekly - 2 sets - 10 reps - 5 sec  hold - Long Sitting Calf Stretch with Strap  - 2 x daily - 7 x weekly - 3 sets - 30 sec  hold - Seated Hamstring Stretch  - 2 x daily - 7 x weekly - 3 sets - 30 sec  hold - Seated Knee Flexion AAROM  - 2 x daily - 7 x weekly - 1 sets - 10 reps - 5 sec  hold - Sidelying Hip Abduction (Mirrored)  - 1 x daily - 7 x weekly - 3 sets - 15 reps - Prone Knee Flexion  - 1 x daily - 7 x weekly - 2 sets - 15 reps - Standing Terminal Knee Extension at Wall with Ball  - 1 x daily - 7 x weekly - 2 sets - 10 reps - 5 seconds hold - SLR  - 1 x daily - 7 x weekly - 3 sets - 15 reps - 1 hold - Standing Hip Extension Kicks  - 1 x daily - 7 x weekly - 2 sets - 10 reps - Standing Hip Abduction with Resistance at Ankles and Counter Support  - 1 x daily - 7 x weekly - 2 sets - 10 reps  - Squat with Chair Touch  - 1 x daily - 7 x weekly - 2 sets - 10 reps - Single leg dead lift- use counter support  - 1 x daily - 7 x weekly - 2 sets - 10 reps - Single Leg Stance on Foam Pad  - 1 x daily - 7 x weekly - 1 sets - 3 reps - 30 hold  ASSESSMENT:  CLINICAL IMPRESSION: PT was completed for closed chain quad and LE strengthening with progressive demand. AROM for knee ext has improved to 0d with QS. Pt will continue to benefit from skilled PT to address impairments for improved function and RTW. Reassess FOTO next week.  12/22/22 : PT was completed with emphasis on strengthening and single leg stability. Expanded HEP to include more closed chain. Pt is nearly 9 weeks post op and will see MD for follow up today. She will benefit from skilled PT to address the above stated deficits in order to return to optimal function and return work lifting up to 50 pounds.   EVALUATION: Patient  is a 32 y.o. female who was seen today for physical therapy evaluation and treatment for s/p Lt ACL reconstruction with FGL graft-link on 10/16/22. She demonstrates ROM, strength, gait and balance deficits that are consistent with her recent post-operative status. She was encouraged to continue to wear  her brace due to poor quad activation with patient verbalizing understanding. She will benefit from skilled PT to address the above stated deficits in order to return to optimal function.   OBJECTIVE IMPAIRMENTS: Abnormal gait, decreased activity tolerance, decreased balance, decreased endurance, decreased knowledge of condition, difficulty walking, decreased ROM, decreased strength, improper body mechanics, postural dysfunction, and pain.   ACTIVITY LIMITATIONS: carrying, lifting, bending, standing, squatting, stairs, transfers, and locomotion level  PARTICIPATION LIMITATIONS: meal prep, cleaning, laundry, driving, shopping, community activity, and occupation  PERSONAL FACTORS: Age, Fitness, Profession, and Time since onset of injury/illness/exacerbation are also affecting patient's functional outcome.   REHAB POTENTIAL: Good  CLINICAL DECISION MAKING: Stable/uncomplicated  EVALUATION COMPLEXITY: Low   GOALS: Goals reviewed with patient? Yes  SHORT TERM GOALS: Target date: 12/15/2022   Patient will be independent and compliant with initial HEP.   Baseline: issued at eval.  Goal status: MET  2.  Patient will perform SLR without quad lag to improve gait stability.  Baseline: see above 11/24/22: min lag present  12/22/22: slight quad lag  Goal status: ONGOING  3.  Patient will demonstrate at least 90 degrees of Lt knee flexion AROM to improve ability to complete sit to stand.   Baseline: see above Goal status: MET  4.  Patient will demonstrate full Lt knee extension AROM to improve gait mechanics.  Baseline: see above 11/24/22: lacks 3-5 degrees passively  12/22/22: slight quad lag  present 01/01/23: AROM c QS 0d Goal status: MET for AROM  5. Patient will report pain at worst rated as </=5/10 to reduce current functional limitations.   Baseline: see above  12/10/22: 7-8/10 at night  01/01/23; 0-1/10  Goal Status: MET   LONG TERM GOALS: Target date: 01/31/2023    Patient will demonstrate 5/5 Lt knee strength to improve stability with stair and curb negotiation.  Baseline: deferred due to post-op acuity Goal status: ONGOING  2.  Patient will demonstrate at least 120 degrees of Lt knee flexion AROM to improve ability to complete bending activity.  Baseline: see above Goal status: MET  3.  Patient will demonstrate normalized and pain free squat mechanics to improve ability to complete lifting activity at work.  Baseline: unable 12/22/22: not returned to work yet, needs to work on squat mechanics and strengthening, needs to lift 50#  Goal status: ONGOING  4.  Patient will ambulate community distances without AD with </=2/10 pain Baseline: SPC, increased pain with prolonged walking.  12/22/22: uses as needed , mostly not using  Goal status: MET  5.  Patient will be able to ascend/descend stairs with reciprocal pattern without UE support Baseline: step to pattern, utilizes handrail and [facility] 12/22/22: hesitancy on descent , lacks eccentric control, needs 1 UE Goal status: ONGOING  6.  Patient will score at least 61% on FOTO to signify clinically meaningful improvement in functional abilities.  Baseline: 30 11/26/22: 57 Goal status: ONGOING   PLAN:  PT FREQUENCY: 2x/week  PT DURATION: 12 weeks  PLANNED INTERVENTIONS: Therapeutic exercises, Therapeutic activity, Neuromuscular re-education, Balance training, Gait training, Patient/Family education, Self Care, Joint mobilization, Dry Needling, Electrical stimulation, Cryotherapy, Moist heat, Vasopneumatic device, Manual therapy, and Re-evaluation  PLAN FOR NEXT SESSION: review and progress HEP; quad  strengthening, gait training. Knee ROM, lifting for RTW  Dasie Daft MS, PT 01/01/23 2:29 PM   Check all possible CPT codes: 02835 - PT Re-evaluation, 97110- Therapeutic Exercise, [phone]- Neuro Re-education, [phone] - Gait Training, [phone] - Manual Therapy, [phone] - Therapeutic Activities,  02464 - Self Care, 02967 - Electrical stimulation (Manual), and [id number] - Vaso                        Check all conditions that are expected to impact treatment: {Conditions expected to impact treatment:None of these apply        If treatment provided at initial evaluation, no treatment charged due to lack of authorization.   PHYSICAL THERAPY DISCHARGE SUMMARY  Visits from Start of Care: 16  Current functional level related to goals / functional outcomes: See clinical impression and PT goals. Pt stopped attending PT.    Remaining deficits: See clinical impression and PT goals. Pt stopped attending PT.    Education / Equipment: HEP/Pt Ed   Patient agrees to discharge. Patient goals were partially met. Patient is being discharged due to not returning since the last visit. Pt was making good progress prior to her stopping to attend PT.  Jeral Zick MS, PT 01/26/24 1:26 PM

## 2023-08-29 ENCOUNTER — Ambulatory Visit (HOSPITAL_COMMUNITY)
Admission: EM | Admit: 2023-08-29 | Discharge: 2023-08-29 | Disposition: A | Discharge status: Home / Self Care | Attending: Family Medicine | Admitting: Family Medicine

## 2023-08-29 ENCOUNTER — Ambulatory Visit (INDEPENDENT_AMBULATORY_CARE_PROVIDER_SITE_OTHER)

## 2023-08-29 ENCOUNTER — Other Ambulatory Visit: Payer: Self-pay

## 2023-08-29 ENCOUNTER — Encounter (HOSPITAL_COMMUNITY): Payer: Self-pay | Admitting: Emergency Medicine

## 2023-08-29 DIAGNOSIS — M79605 Pain in left leg: Secondary | ICD-10-CM

## 2023-08-29 MED ORDER — IBUPROFEN 800 MG PO TABS: 800.0000 mg | ORAL_TABLET | Freq: Three times a day (TID) | ORAL | 0 refills | Status: AC
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
